# Patient Record
Sex: Female | Born: 1995
Health system: Southern US, Community
[De-identification: ages and names within clinical notes are randomized; demographics above are authoritative.]

## PROBLEM LIST (undated history)

## (undated) DIAGNOSIS — F329 Major depressive disorder, single episode, unspecified: Secondary | ICD-10-CM

## (undated) DIAGNOSIS — F32A Depression, unspecified: Secondary | ICD-10-CM

## (undated) DIAGNOSIS — F419 Anxiety disorder, unspecified: Secondary | ICD-10-CM

## (undated) HISTORY — DX: Depression, unspecified: F32.A

## (undated) HISTORY — PX: TONSILLECTOMY AND ADENOIDECTOMY: SUR1326

## (undated) HISTORY — PX: KNEE SURGERY: SHX244

## (undated) HISTORY — DX: Anxiety disorder, unspecified: F41.9

---
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
## 2018-10-22 DIAGNOSIS — F4323 Adjustment disorder with mixed anxiety and depressed mood: Secondary | ICD-10-CM | POA: Diagnosis not present

## 2018-12-21 DIAGNOSIS — E663 Overweight: Secondary | ICD-10-CM | POA: Diagnosis not present

## 2019-03-25 ENCOUNTER — Emergency Department: Payer: BC Managed Care – PPO

## 2019-03-25 ENCOUNTER — Encounter: Payer: Self-pay | Admitting: Emergency Medicine

## 2019-03-25 ENCOUNTER — Other Ambulatory Visit: Payer: Self-pay

## 2019-03-25 ENCOUNTER — Emergency Department
Admission: EM | Admit: 2019-03-25 | Discharge: 2019-03-25 | Disposition: A | Discharge status: Home / Self Care | Payer: BC Managed Care – PPO | Attending: Student in an Organized Health Care Education/Training Program | Admitting: Student in an Organized Health Care Education/Training Program

## 2019-03-25 DIAGNOSIS — S5012XA Contusion of left forearm, initial encounter: Secondary | ICD-10-CM | POA: Insufficient documentation

## 2019-03-25 DIAGNOSIS — Y999 Unspecified external cause status: Secondary | ICD-10-CM | POA: Diagnosis not present

## 2019-03-25 DIAGNOSIS — Y92410 Unspecified street and highway as the place of occurrence of the external cause: Secondary | ICD-10-CM | POA: Insufficient documentation

## 2019-03-25 DIAGNOSIS — Y9389 Activity, other specified: Secondary | ICD-10-CM | POA: Diagnosis not present

## 2019-03-25 DIAGNOSIS — S59912A Unspecified injury of left forearm, initial encounter: Secondary | ICD-10-CM | POA: Diagnosis present

## 2019-03-25 DIAGNOSIS — M542 Cervicalgia: Secondary | ICD-10-CM | POA: Diagnosis not present

## 2019-03-25 DIAGNOSIS — M79632 Pain in left forearm: Secondary | ICD-10-CM | POA: Diagnosis not present

## 2019-03-25 DIAGNOSIS — M7989 Other specified soft tissue disorders: Secondary | ICD-10-CM | POA: Diagnosis not present

## 2019-03-25 NOTE — ED Notes (Signed)
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
## 2019-03-25 NOTE — Discharge Instructions (Addendum)
Follow-up with your primary care provider or can no clinic acute care if any continued problems.  Apply ice to your forearm as needed for swelling and also to help with pain.  You may take Tylenol or ibuprofen as needed for pain.  You will be sore for approximately 4 to 5 days.  This is not unusual even with medication.  You may also use moist heat or ice to your muscles if needed for stiffness and soreness.
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
## 2019-05-19 ENCOUNTER — Other Ambulatory Visit (HOSPITAL_COMMUNITY)
Admission: RE | Admit: 2019-05-19 | Discharge: 2019-05-19 | Disposition: A | Discharge status: Home / Self Care | Payer: BC Managed Care – PPO | Source: Ambulatory Visit | Attending: Obstetrics and Gynecology | Admitting: Obstetrics and Gynecology

## 2019-05-19 ENCOUNTER — Other Ambulatory Visit: Payer: Self-pay

## 2019-05-19 ENCOUNTER — Encounter: Payer: Self-pay | Admitting: Obstetrics and Gynecology

## 2019-05-19 ENCOUNTER — Ambulatory Visit (INDEPENDENT_AMBULATORY_CARE_PROVIDER_SITE_OTHER): Payer: BC Managed Care – PPO | Admitting: Obstetrics and Gynecology

## 2019-05-19 ENCOUNTER — Telehealth: Payer: Self-pay | Admitting: Obstetrics and Gynecology

## 2019-05-19 VITALS — BP 120/70 | HR 66 | Ht 65.0 in | Wt 169.0 lb

## 2019-05-19 DIAGNOSIS — F329 Major depressive disorder, single episode, unspecified: Secondary | ICD-10-CM

## 2019-05-19 DIAGNOSIS — Z01419 Encounter for gynecological examination (general) (routine) without abnormal findings: Secondary | ICD-10-CM

## 2019-05-19 DIAGNOSIS — Z124 Encounter for screening for malignant neoplasm of cervix: Secondary | ICD-10-CM

## 2019-05-19 DIAGNOSIS — F419 Anxiety disorder, unspecified: Secondary | ICD-10-CM

## 2019-05-19 DIAGNOSIS — Z0000 Encounter for general adult medical examination without abnormal findings: Secondary | ICD-10-CM

## 2019-05-19 DIAGNOSIS — Z113 Encounter for screening for infections with a predominantly sexual mode of transmission: Secondary | ICD-10-CM

## 2019-05-19 DIAGNOSIS — Z3041 Encounter for surveillance of contraceptive pills: Secondary | ICD-10-CM

## 2019-05-19 MED ORDER — ORTHO-NOVUM 1/35 (28) 1-35 MG-MCG PO TABS: 1.0000 | ORAL_TABLET | Freq: Every day | ORAL | 11 refills | Status: DC

## 2019-05-19 MED ORDER — ALPRAZOLAM 0.5 MG PO TABS: 0.5000 mg | ORAL_TABLET | Freq: Two times a day (BID) | ORAL | 5 refills | Status: DC | PRN

## 2019-05-19 MED ORDER — ESCITALOPRAM OXALATE 10 MG PO TABS: 10.0000 mg | ORAL_TABLET | Freq: Every day | ORAL | 3 refills | Status: DC

## 2019-05-19 NOTE — Patient Instructions (Signed)
Therapists/Counselors/Psychologists   Karen [place], LPC  & Jacqlyn Krauss [place]    [phone]        [address]       [place], [place] [address]        Ival Bible, CSW [phone] [address] [place], [place] [address]  Harle Battiest, [place]        [phone]        [address]      [place], [place] [address]        Chyrel Masson, MS [phone] [address] [place], [place] [address]   Oscar La, LMFT       [phone]        [address]       [place], [place] [address]        Felecia Jan [phone] [address] [place], [place] [address]  Lester Posen        [phone]        [address]       [place], [place] [address]        Kerin Salen [phone] [address] [place], [place] [address]  Tyron Russell, PsyD       [phone]        [address]       [place], [place] [address]        Elita Quick, LPC [phone] [address]  [place], [place] [address]   Debarah Crape        [phone]        [address] [place], [place] [address]         Rosana Hoes [facility] [phone] [email or web address]   Sation Konchella       [phone]        [address]       [place], [place] [address]        Morton Stall [facility] [phone] [email or web address]   Coping With Anxiety  Anxiety is the feeling of nervousness or worry that you might experience when faced with a stressful event, like a test or a big sports game. Occasional stress and anxiety caused by work, school, relationships, or decision-making is a normal part of life, and it can be managed through certain lifestyle habits. However, some people may experience anxiety:  Without a specific trigger.  For long periods of time.  That causes physical problems over time.  That is far more intense than typical stress. When these feelings become  overwhelming and interfere with daily activities and relationships, it may indicate an anxiety disorder. If you receive a diagnosis of an anxiety disorder, your health care provider will tell you which type of anxiety you have and the possible treatments to help. How can anxiety affect me? Anxiety may make you feel uncomfortable. When you are faced with something exciting or potentially dangerous, your body responds in a way that prepares it to fight or run away. This response, called "fight or flight," is also a normal response to stress. When your brain initiates the fight and flight response, it tells your body to get the blood moving and prepare for the demands of the expected challenge. When this happens, you may experience:  A faster than usual heart rate.  Blood flowing to your big muscles  A feeling of tension and focus. In some situations, such as during  a big game or performance, this response a good thing and can help you perform better. However, in most situations, this response is not helpful. When the fight and flight response lasts for hours or days, it may cause:  Tiredness or exhaustion.  Sleep problems.  Upset stomach or nausea.  Headache.  Feelings of depression. Long-term anxiety may also cause you to:  Think negative thoughts about yourself.  Experience problems and conflicts in relationships.  Distance yourself from friends, family, and activities you enjoy.  Perform poorly in school, sports, work or extracurricular activities. What are things that I can do to deal with anxiety? When you experience anxiety, you can take steps to help manage it:  Talk with a trusted friend or family member about your thoughts and feelings. Identify two or three people who you think might help.  Find an activity that helps calm you down, such as: ? Deep breathing. ? Listening to music. ? Taking a walk. ? Exercising. ? Playing sports for fun. ? Playing an instrument. ?  Singing. ? Writing in a dairy. ? Drawing.  Watch a funny movie.  Read a good book.  Spend time with friends. What should I do if my anxiety gets worse? If these self-calming methods are not working or if your anxiety gets worse, you should get help from a health care provider. Talking with your health care provider or a mental health counselor is not a sign of weakness. Certain types of counseling can be very helpful in treating anxiety. A counseling professional can assess what other types of treatments could be most helpful for you. Other treatments include:  Talk therapy.  Medicines.  Biofeedback.  Meditation.  Yoga. Talk with your health care provider or counselor about what treatment options are right for you. Where can I get support? You may find that joining a support group helps you deal with your anxiety. Resources for locating counselors or support groups in your area are available from the following sources:  [place]: www.mentalhealthamerica.net  Anxiety and Depression Association of [place] (ADAA): https://www.clark.net/  National Alliance on Mental Illness (NAMI): www.nami.org This information is not intended to replace advice given to you by your health care provider. Make sure you discuss any questions you have with your health care provider. Document Released: 07/28/2016 Document Revised: 08/14/2017 Document Reviewed: 07/28/2016 Elsevier Patient Education  2020 [place] With Depression Depression is an experience of feeling down, blue, or sad. Depression can affect your thoughts and feelings, relationships, daily activities, and physical health. It is caused by changes in your brain that can be triggered by stress in your life or a serious loss. Everyone experiences occasional disappointment, sadness, and loss in their lives. When you are feeling down, blue, or sad for at least 2 weeks in a row, it may mean that you have depression. If you receive  a diagnosis of depression, your health care provider will tell you which type of depression you have and the possible treatments to help. How can depression affect me? Being depressed can make daily activities more difficult. It can negatively affect your daily life, from school and sports performance to work and relationships. When you are depressed, you may:  Want to be alone.  Avoid interacting with others.  Avoid doing the things you usually like to do.  Notice changes in your sleep habits.  Find it harder than usual to wake up and go to school or work.  Feel angry at everyone.  Feel like you do not have any patience.  Have trouble concentrating.  Feel tired all the time.  Notice changes in your appetite.  Lose or gain weight without trying.  Have constant headaches or stomachaches.  Think about death or attempting suicide often. What are things I can do to deal with depression? If you have had symptoms of depression for more than 2 weeks, talk with your parents or an adult you trust, such as a Veterinary surgeoncounselor at school or church or a Psychologist, occupationalcoach. You might be tempted to only tell friends, but you should tell an adult too. The hardest step in dealing with depression is admitting that you are feeling it to someone. The more people who know, the more likely you will be to get some help. Certain types of counseling can be very helpful in treating depression. A counseling professional can assess what treatments are going to be most helpful for you. These may include:  Talk therapy.  Medicines.  Brain stimulation therapy. There are a number of other things you can do that can help you cope with depression on a daily basis, including:  Spending time in nature.  Spending time with trusted friends who help you feel better.  Taking time to think about the positive things in your life and to feel grateful for them.  Exercising, such as playing an active game with some friends or going for  a run.  Spending less time using electronics, especially at night before bed. The screens of TVs, computers, tablets, and phones make your brain think it is time to get up rather than go to bed.  Avoiding spending too much time spacing out on TV or video games. This might feel good for a while, but it ends up just being a way to avoid the feelings of depression. What should I do if my depression gets worse? If you are having trouble managing your depression or if your depression gets worse, talk to your health care provider about making adjustments to your treatment plan. You should get help immediately if:  You feel suicidal and are making a plan to commit suicide.  You are drinking or using drugs to stop the pain from your depression.  You are cutting yourself or thinking about cutting yourself.  You are thinking about hurting others and are making a plan to do so.  You believe the world would be better off without you in it.  You are isolating yourself completely and not talking with anyone. If you find yourself in any of these situations, you should do one of the following:  Immediately tell your parents or best friend.  Call and go see your health care provider or health professional.  Call the suicide prevention hotline (940-507-92471-661-346-7171 in the [place]  Text the crisis line 912-779-1023(741741 in the [place] Where can I get support? It is important to know that although depression is serious, you can find support from a variety of sources. Sources of help may include:  Suicide prevention, crisis prevention, and depression hotlines.  School teachers, counselors, Systems developercoaches, or clergy.  Parents or other family members.  Support groups. You can locate a counselor or support group in your area from one of the following sources:  Mental Health [place]: www.mentalhealthamerica.net  Anxiety and Depression Association of [place] (ADAA): ProgramCam.dewww.adaa.org  The First Americanational Alliance on Mental Illness  (NAMI): www.nami.org This information is not intended to replace advice given to you by your health care provider. Make sure you discuss any questions you  have with your health care provider. Document Released: 09/21/2015 Document Revised: 08/14/2017 Document Reviewed: 09/21/2015 Elsevier Patient Education  2020 [employer]

## 2019-05-19 NOTE — Progress Notes (Signed)
Gynecology Annual Exam  PCP: Patient, No Pcp Per  Chief Complaint:  Chief Complaint  Patient presents with  . Gynecologic Exam    History of Present Illness: Patient is a 23 y.o. G0P0000 presents for annual exam. The patient has no complaints today.   LMP: Patient's last menstrual period was 05/10/2019 (exact date). Average Interval: regular, 28 days Duration of flow: 4 days Heavy Menses: no Clots: no Intermenstrual Bleeding: no Postcoital Bleeding: no Dysmenorrhea: no  The patient is not currently sexually active. She currently uses OCP (estrogen/progesterone) for contraception. She denies dyspareunia.  The patient does perform self breast exams.  There is no notable family history of breast or ovarian cancer in her family.  The patient wears seatbelts: yes.  The patient has regular exercise: yes.    The patient repots current symptoms of depression.    Review of Systems: ROS  Past Medical History:  Past Medical History:  Diagnosis Date  . Anxiety   . Depression     Past Surgical History:  Past Surgical History:  Procedure Laterality Date  . KNEE SURGERY    . TONSILLECTOMY AND ADENOIDECTOMY      Gynecologic History:  Patient's last menstrual period was 05/10/2019 (exact date). Contraception: OCP (estrogen/progesterone) Last Pap: Results were: Never    Obstetric History: G0P0000  Family History:  History reviewed. No pertinent family history.  Social History:  Social History   Socioeconomic History  . Marital status: Single    Spouse name: Not on file  . Number of children: Not on file  . Years of education: Not on file  . Highest education level: Not on file  Occupational History  . Not on file  Social Needs  . Financial resource strain: Not on file  . Food insecurity    Worry: Not on file    Inability: Not on file  . Transportation needs    Medical: Not on file    Non-medical: Not on file  Tobacco Use  . Smoking status: Never Smoker   . Smokeless tobacco: Never Used  Substance and Sexual Activity  . Alcohol use: Yes    Frequency: Never  . Drug use: Never  . Sexual activity: Not Currently    Birth control/protection: Pill  Lifestyle  . Physical activity    Days per week: Not on file    Minutes per session: Not on file  . Stress: Not on file  Relationships  . Social Herbalist on phone: Not on file    Gets together: Not on file    Attends religious service: Not on file    Active member of club or organization: Not on file    Attends meetings of clubs or organizations: Not on file    Relationship status: Not on file  . Intimate partner violence    Fear of current or ex partner: Not on file    Emotionally abused: Not on file    Physically abused: Not on file    Forced sexual activity: Not on file  Other Topics Concern  . Not on file  Social History Narrative  . Not on file    Allergies:  No Known Allergies  Medications: Prior to Admission medications   Medication Sig Start Date End Date Taking? Authorizing Provider  [facility] 7/7/7 0.5/0.75/1-35 MG-MCG tablet TK 1 T PO QD 05/10/19  Yes [provider]  norethindrone-ethinyl estradiol 1/35 ([place] 1/35, 28,) tablet Take 1 tablet by mouth daily. 05/19/19  Natale MilchSchuman, Corinthia Helmers R, MD    Physical Exam Vitals: Blood pressure 120/70, pulse 66, height 5\' 5"  (1.651 m), weight 169 lb (76.7 kg), last menstrual period 05/10/2019.  General: NAD HEENT: normocephalic, anicteric Thyroid: no enlargement, no palpable nodules Pulmonary: No increased work of breathing, CTAB Cardiovascular: RRR, distal pulses 2+ Breast: Breast symmetrical, no tenderness, no palpable nodules or masses, no skin or nipple retraction present, no nipple discharge.  No axillary or supraclavicular lymphadenopathy. Abdomen: NABS, soft, non-tender, non-distended.  Umbilicus without lesions.  No hepatomegaly, splenomegaly or masses palpable. No evidence of hernia   Genitourinary:  External: Normal external female genitalia.  Normal urethral meatus, normal Bartholin's and Skene's glands.    Vagina: Normal vaginal mucosa, no evidence of prolapse.    Cervix: Grossly normal in appearance, no bleeding  Uterus: Non-enlarged, mobile, normal contour.  No CMT  Adnexa: ovaries non-enlarged, no adnexal masses  Rectal: deferred  Lymphatic: no evidence of inguinal lymphadenopathy Extremities: no edema, erythema, or tenderness Neurologic: Grossly intact Psychiatric: mood appropriate, affect full  Female chaperone present for pelvic and breast  portions of the physical exam    Assessment: 23 y.o. G0P0000 routine annual exam  Plan: Problem List Items Addressed This Visit    None    Visit Diagnoses    Healthcare maintenance    -  Primary   Cervical cancer screening       Relevant Orders   Cytology - PAP   Screening for STD (sexually transmitted disease)       Encounter for birth control pills maintenance       Relevant Medications   norethindrone-ethinyl estradiol 1/35 (ORTHO-NOVUM 1/35, 28,) tablet   Anxiety and depression       Relevant Medications   ALPRAZolam (XANAX) 0.5 MG tablet   escitalopram (LEXAPRO) 10 MG tablet      1)  Gardasil Series discussed and if applicable offered to patient - Patient has previously completed 3 shot series   2) STI screening  was offered and accepted  3)  ASCCP guidelines and rational discussed.  Patient opts for every 3 years screening interval  4) Contraception - the patient is currently using  OCP (estrogen/progesterone).  She is happy with her current form of contraception and plans to continue We discussed safe sex practices to reduce her furture risk of STI's.    5) Depression and anxiety- patient will start medication with SSRI and anxiolytic. Discussed coping techniques as well.  Given list of local therapists. Will follow up for response to medication.   6) Return in about 4 weeks (around 06/16/2019)  for telephone or in person GYN follow up.  Adelene Idlerhristanna Johnothan Bascomb MD [facility], [facility] 05/19/2019 8:35 PM

## 2019-05-25 LAB — CYTOLOGY - PAP
Chlamydia: NEGATIVE
Diagnosis: NEGATIVE
Neisseria Gonorrhea: NEGATIVE

## 2019-05-25 NOTE — Progress Notes (Signed)
Please call patient with normal result. Encourage her to set up mychart Thank you Dr. Gilman Schmidt

## 2019-05-27 DIAGNOSIS — F4323 Adjustment disorder with mixed anxiety and depressed mood: Secondary | ICD-10-CM | POA: Diagnosis not present

## 2019-06-10 DIAGNOSIS — F4323 Adjustment disorder with mixed anxiety and depressed mood: Secondary | ICD-10-CM | POA: Diagnosis not present

## 2019-06-16 ENCOUNTER — Ambulatory Visit (INDEPENDENT_AMBULATORY_CARE_PROVIDER_SITE_OTHER): Payer: Self-pay | Admitting: Obstetrics and Gynecology

## 2019-06-16 ENCOUNTER — Other Ambulatory Visit: Payer: Self-pay

## 2019-06-16 ENCOUNTER — Encounter: Payer: Self-pay | Admitting: Obstetrics and Gynecology

## 2019-06-16 DIAGNOSIS — F329 Major depressive disorder, single episode, unspecified: Secondary | ICD-10-CM

## 2019-06-16 DIAGNOSIS — F419 Anxiety disorder, unspecified: Secondary | ICD-10-CM

## 2019-06-16 NOTE — Progress Notes (Signed)
Virtual Visit via Telephone Note  I connected with Maria Andersen on 06/16/19 at  9:10 AM EDT by telephone and verified that I am speaking with the correct person using two identifiers.   I discussed the limitations, risks, security and privacy concerns of performing an evaluation and management service by telephone and the availability of in person appointments. I also discussed with the patient that there may be a patient responsible charge related to this service. The patient expressed understanding and agreed to proceed.  The patient was at home I spoke with the patient from my  office The names of people involved in this encounter were: Maria Andersen and Dr. Gilman Schmidt.   History of Present Illness: She reports that she has been doing significantly better since her most recent visit. She is happy with her medication. She feels less depressed and anxious. She feels less tired as well.  She has started seeing a therapist every two weeks and reports that this is helping her a lot.    Observations/Objective:   Physical Exam could not be performed. Because of the COVID-19 outbreak this visit was performed over the phone and not in person.   Assessment and Plan: 23 yo following up for depression and anxiety.  Continue current medications. Continue with therapy   Follow Up Instructions: Return in 1 year for annual exam   I discussed the assessment and treatment plan with the patient. The patient was provided an opportunity to ask questions and all were answered. The patient agreed with the plan and demonstrated an understanding of the instructions.   The patient was advised to call back or seek an in-person evaluation if the symptoms worsen or if the condition fails to improve as anticipated.  I provided 5 minutes of non-face-to-face time during this encounter.  Adrian Prows MD [facility], [facility] 06/16/2019 9:13 AM

## 2019-07-12 DIAGNOSIS — F4323 Adjustment disorder with mixed anxiety and depressed mood: Secondary | ICD-10-CM | POA: Diagnosis not present

## 2019-07-26 DIAGNOSIS — F4323 Adjustment disorder with mixed anxiety and depressed mood: Secondary | ICD-10-CM | POA: Diagnosis not present

## 2019-08-09 DIAGNOSIS — F4323 Adjustment disorder with mixed anxiety and depressed mood: Secondary | ICD-10-CM | POA: Diagnosis not present

## 2019-08-25 DIAGNOSIS — F4323 Adjustment disorder with mixed anxiety and depressed mood: Secondary | ICD-10-CM | POA: Diagnosis not present

## 2019-09-13 IMAGING — CR CERVICAL SPINE - 2-3 VIEW
4 series · 4 of 4 positions shown · non-contrast
Comparison: None.

CLINICAL DATA: Pain

EXAM:
CERVICAL SPINE - 2-3 VIEW

[c-spine lat]
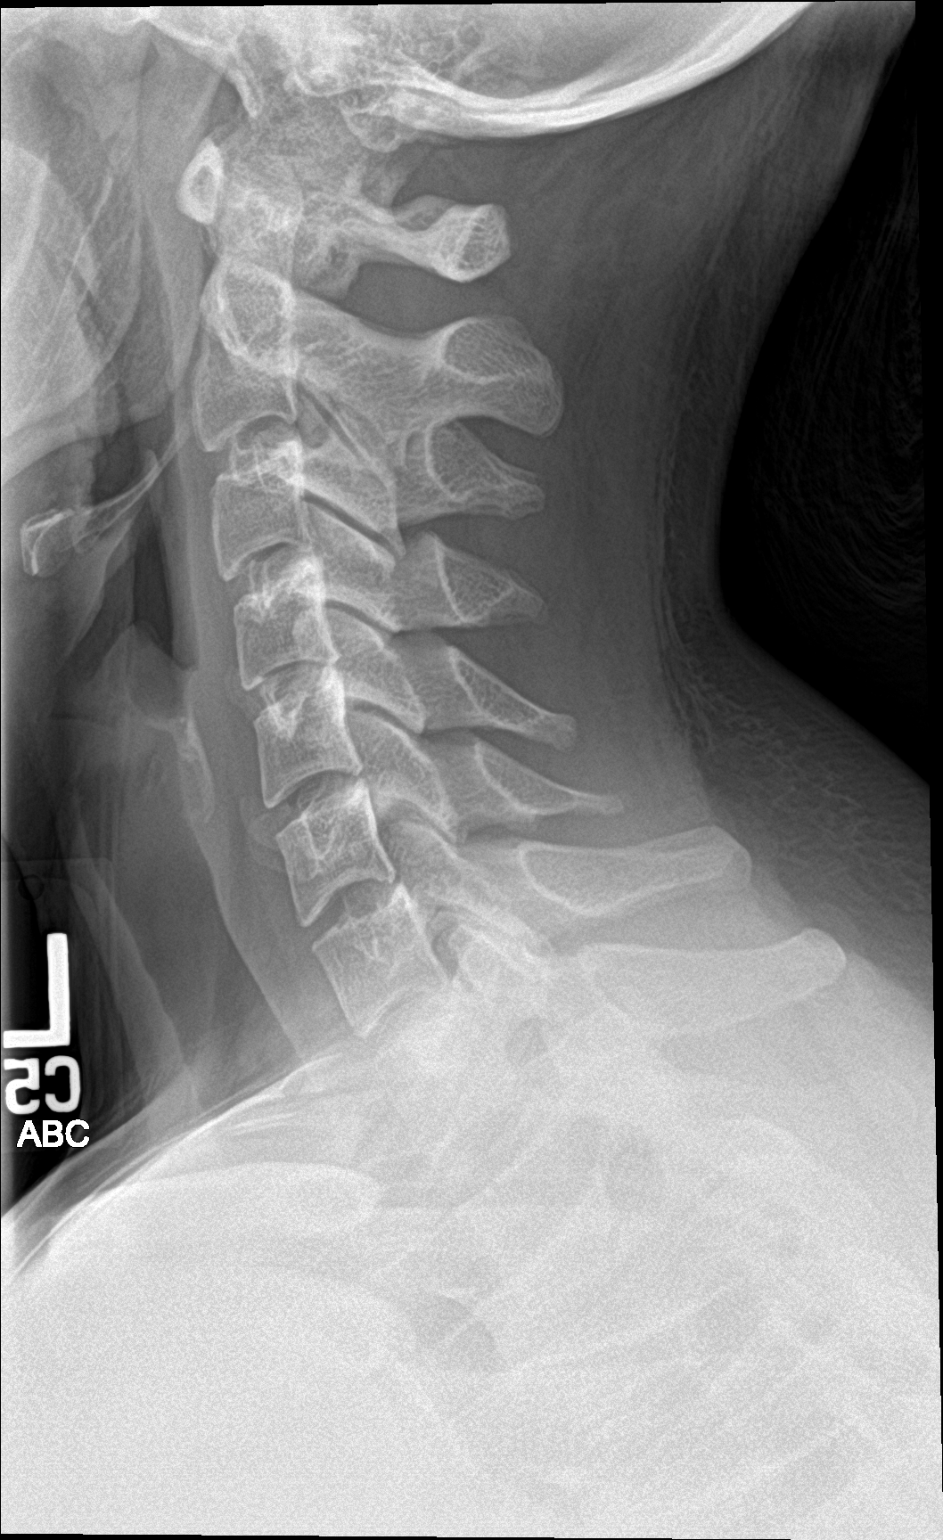

[c-spine ap]
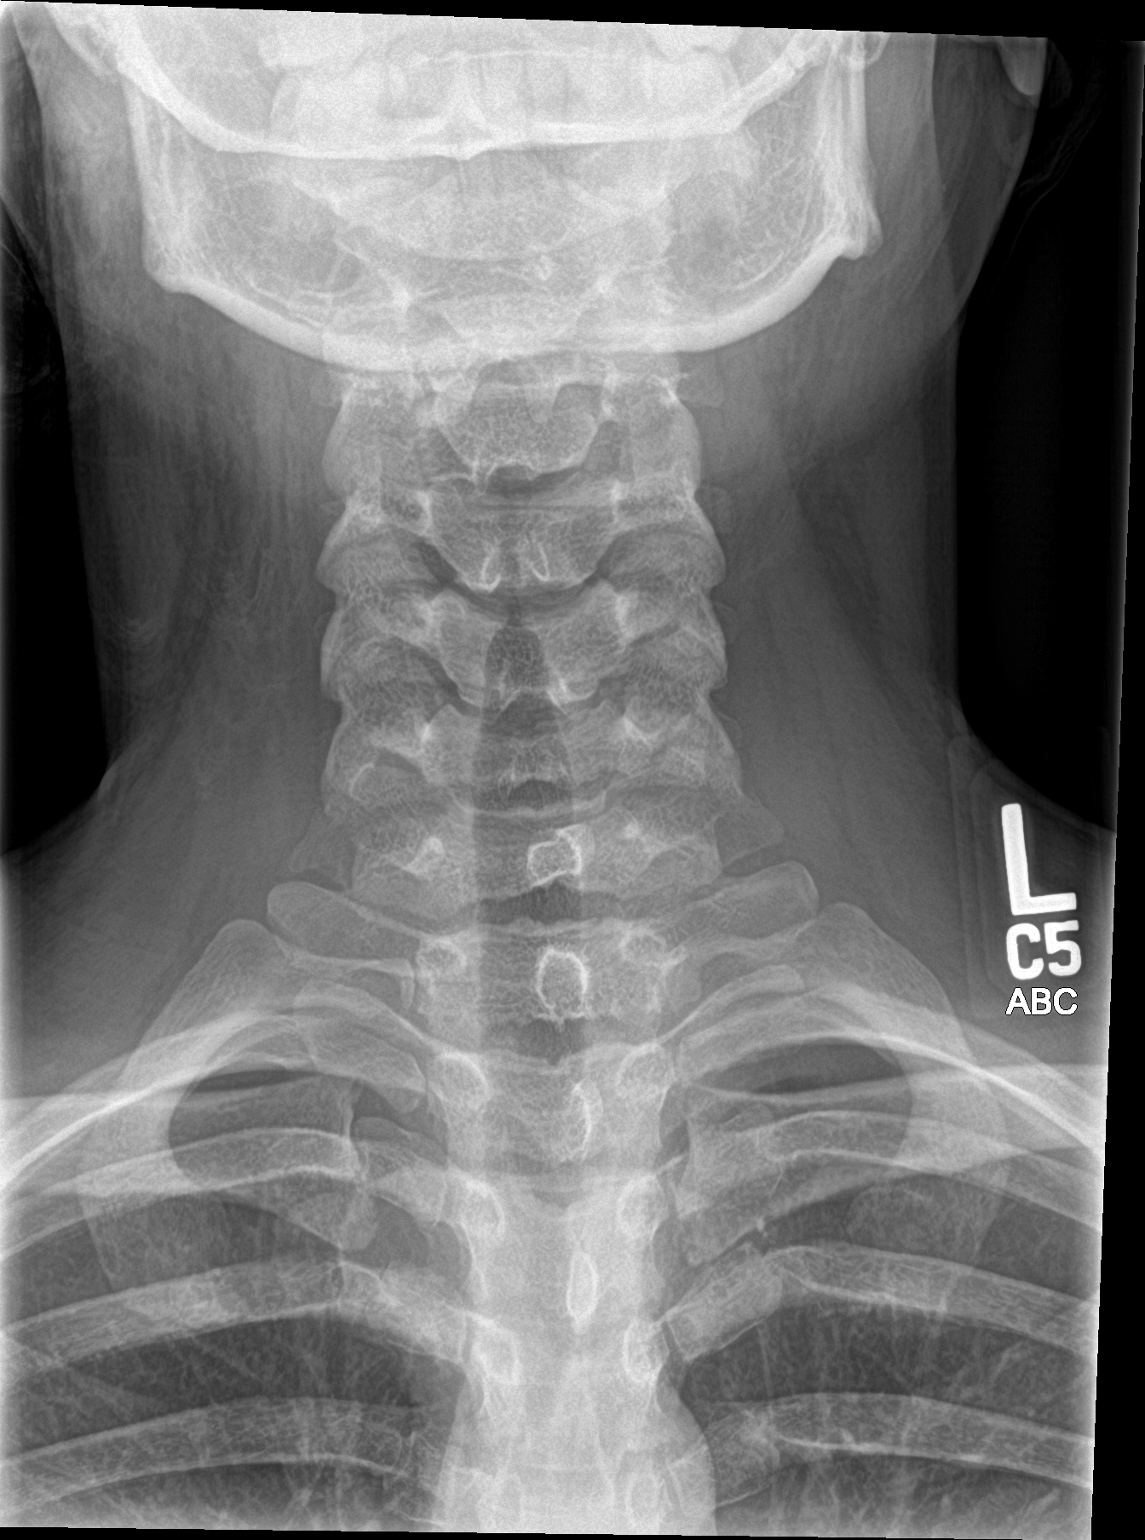

[c-spine open mouth (1 of 2)]
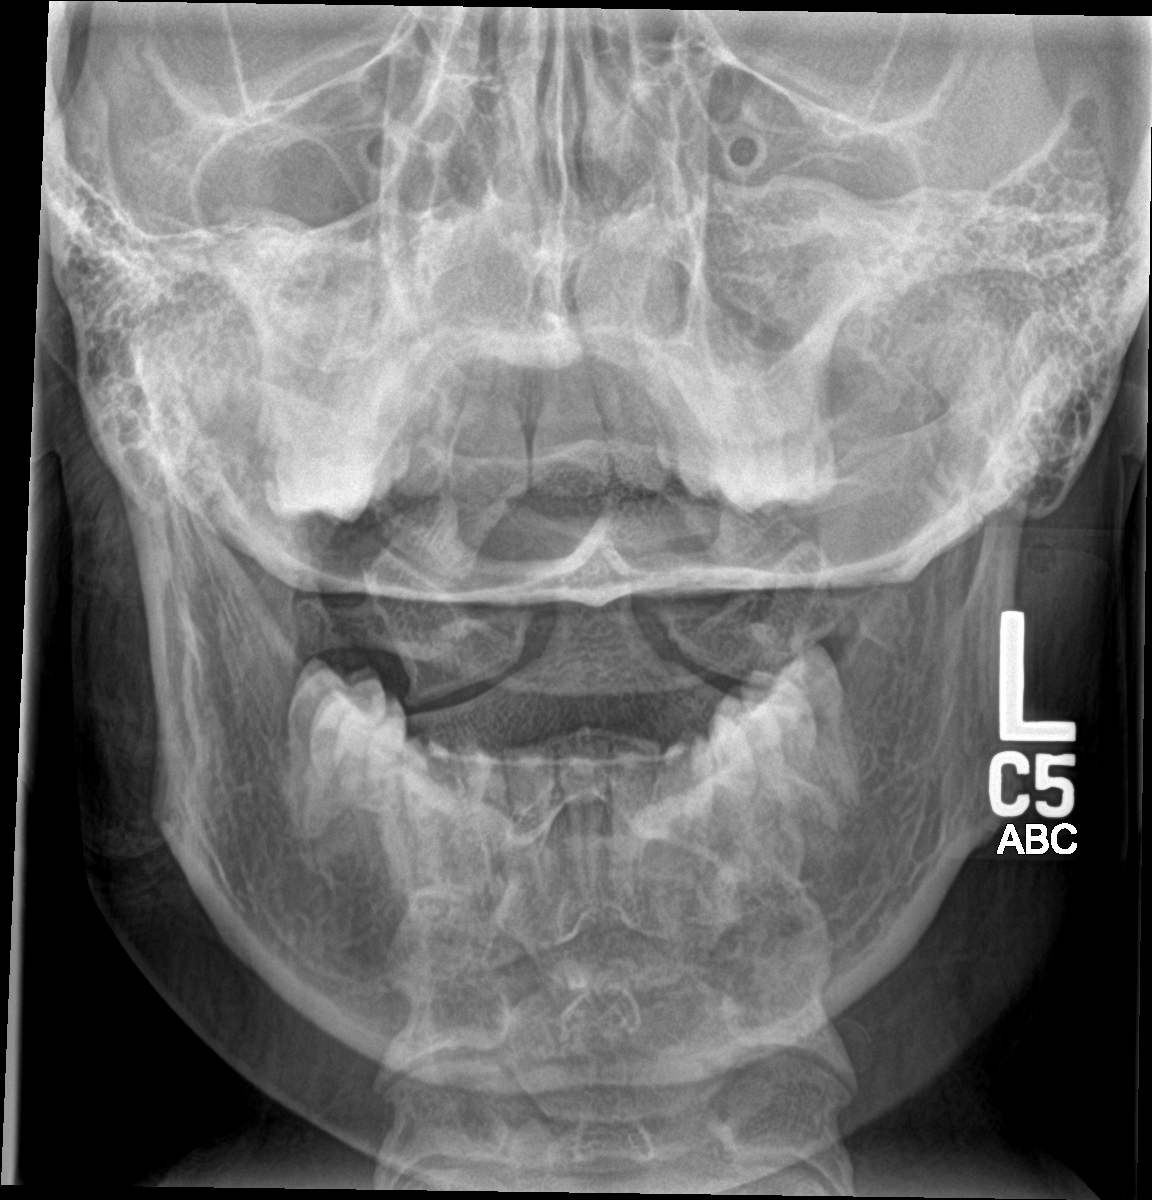

[c-spine open mouth (2 of 2)]
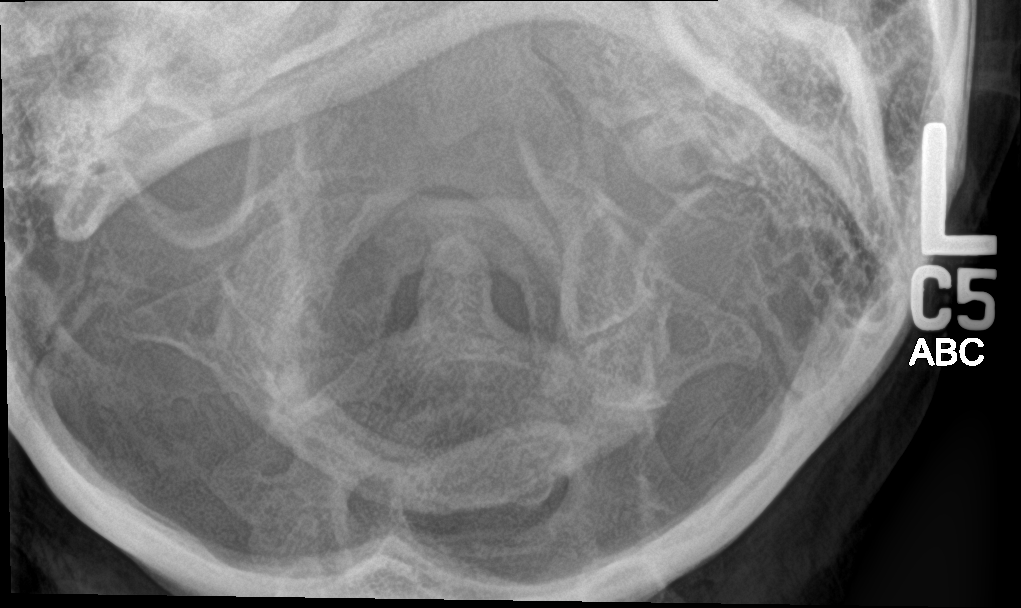

[4 of 4 positions shown; findings below may reference images not displayed]

FINDINGS: There is no evidence of cervical spine fracture or prevertebral soft
tissue swelling. Alignment is normal. No other significant bone
abnormalities are identified.
IMPRESSION: Negative cervical spine radiographs.

## 2019-09-27 DIAGNOSIS — F4323 Adjustment disorder with mixed anxiety and depressed mood: Secondary | ICD-10-CM | POA: Diagnosis not present

## 2019-10-18 DIAGNOSIS — F4323 Adjustment disorder with mixed anxiety and depressed mood: Secondary | ICD-10-CM | POA: Diagnosis not present

## 2019-11-04 DIAGNOSIS — F4323 Adjustment disorder with mixed anxiety and depressed mood: Secondary | ICD-10-CM | POA: Diagnosis not present

## 2019-11-19 ENCOUNTER — Other Ambulatory Visit: Payer: Self-pay | Admitting: Obstetrics and Gynecology

## 2019-11-19 DIAGNOSIS — F329 Major depressive disorder, single episode, unspecified: Secondary | ICD-10-CM

## 2019-11-19 DIAGNOSIS — F419 Anxiety disorder, unspecified: Secondary | ICD-10-CM

## 2019-12-06 DIAGNOSIS — F4323 Adjustment disorder with mixed anxiety and depressed mood: Secondary | ICD-10-CM | POA: Diagnosis not present

## 2020-01-03 DIAGNOSIS — F4323 Adjustment disorder with mixed anxiety and depressed mood: Secondary | ICD-10-CM | POA: Diagnosis not present

## 2020-01-17 DIAGNOSIS — F4323 Adjustment disorder with mixed anxiety and depressed mood: Secondary | ICD-10-CM | POA: Diagnosis not present

## 2020-01-31 DIAGNOSIS — F4323 Adjustment disorder with mixed anxiety and depressed mood: Secondary | ICD-10-CM | POA: Diagnosis not present

## 2020-05-10 ENCOUNTER — Other Ambulatory Visit: Payer: Self-pay | Admitting: Obstetrics and Gynecology

## 2020-05-10 DIAGNOSIS — Z3041 Encounter for surveillance of contraceptive pills: Secondary | ICD-10-CM

## 2020-05-15 ENCOUNTER — Telehealth: Payer: Self-pay | Admitting: Obstetrics and Gynecology

## 2020-05-15 MED ORDER — ORTHO-NOVUM 1/35 (28) 1-35 MG-MCG PO TABS: 1.0000 | ORAL_TABLET | Freq: Every day | ORAL | 0 refills | Status: DC

## 2020-05-15 NOTE — Telephone Encounter (Signed)
Patient is scheduled for 06/12/20 for annual with CRS. Patient is requesting birth control refill to [facility] on [address] [place]. Please advise

## 2020-05-15 NOTE — Telephone Encounter (Signed)
Meds has been refilled.

## 2020-05-15 NOTE — Addendum Note (Signed)
Addended by: Clement Husbands A on: 05/15/2020 03:47 PM   Modules accepted: Orders

## 2020-06-09 ENCOUNTER — Other Ambulatory Visit: Payer: Self-pay | Admitting: Obstetrics and Gynecology

## 2020-06-09 DIAGNOSIS — Z3041 Encounter for surveillance of contraceptive pills: Secondary | ICD-10-CM

## 2020-06-12 ENCOUNTER — Ambulatory Visit (INDEPENDENT_AMBULATORY_CARE_PROVIDER_SITE_OTHER): Payer: BC Managed Care – PPO | Admitting: Obstetrics and Gynecology

## 2020-06-12 ENCOUNTER — Encounter: Payer: Self-pay | Admitting: Obstetrics and Gynecology

## 2020-06-12 ENCOUNTER — Other Ambulatory Visit: Payer: Self-pay

## 2020-06-12 VITALS — BP 114/70 | HR 64 | Resp 18 | Ht 65.0 in | Wt 187.0 lb

## 2020-06-12 DIAGNOSIS — Z3041 Encounter for surveillance of contraceptive pills: Secondary | ICD-10-CM

## 2020-06-12 DIAGNOSIS — Z0000 Encounter for general adult medical examination without abnormal findings: Secondary | ICD-10-CM

## 2020-06-12 DIAGNOSIS — Z01419 Encounter for gynecological examination (general) (routine) without abnormal findings: Secondary | ICD-10-CM | POA: Diagnosis not present

## 2020-06-12 MED ORDER — NORTREL 1/35 (28) 1-35 MG-MCG PO TABS: 1.0000 | ORAL_TABLET | Freq: Every day | ORAL | 11 refills | Status: DC

## 2020-06-12 NOTE — Patient Instructions (Signed)
Institute of Medicine Recommended Dietary Allowances for Calcium and Vitamin D  Age (yr) Calcium Recommended Dietary Allowance (mg/day) Vitamin D Recommended Dietary Allowance (international units/day)  9-18 1,300 600  19-50 1,000 600  51-70 1,200 600  71 and older 1,200 800  Data from Institute of Medicine. Dietary reference intakes: calcium, vitamin D. [place], [place]: National Academies Press; 2011.     Exercising to Stay Healthy To become healthy and stay healthy, it is recommended that you do moderate-intensity and vigorous-intensity exercise. You can tell that you are exercising at a moderate intensity if your heart starts beating faster and you start breathing faster but can still hold a conversation. You can tell that you are exercising at a vigorous intensity if you are breathing much harder and faster and cannot hold a conversation while exercising. Exercising regularly is important. It has many health benefits, such as:  Improving overall fitness, flexibility, and endurance.  Increasing bone density.  Helping with weight control.  Decreasing body fat.  Increasing muscle strength.  Reducing stress and tension.  Improving overall health. How often should I exercise? Choose an activity that you enjoy, and set realistic goals. Your health care provider can help you make an activity plan that works for you. Exercise regularly as told by your health care provider. This may include:  Doing strength training two times a week, such as: ? Lifting weights. ? Using resistance bands. ? Push-ups. ? Sit-ups. ? Yoga.  Doing a certain intensity of exercise for a given amount of time. Choose from these options: ? A total of 150 minutes of moderate-intensity exercise every week. ? A total of 75 minutes of vigorous-intensity exercise every week. ? A mix of moderate-intensity and vigorous-intensity exercise every week. Children, pregnant women, people who have not exercised  regularly, people who are overweight, and older adults may need to talk with a health care provider about what activities are safe to do. If you have a medical condition, be sure to talk with your health care provider before you start a new exercise program. What are some exercise ideas? Moderate-intensity exercise ideas include:  Walking 1 mile (1.6 km) in about 15 minutes.  Biking.  Hiking.  Golfing.  Dancing.  Water aerobics. Vigorous-intensity exercise ideas include:  Walking 4.5 miles (7.2 km) or more in about 1 hour.  Jogging or running 5 miles (8 km) in about 1 hour.  Biking 10 miles (16.1 km) or more in about 1 hour.  Lap swimming.  Roller-skating or in-line skating.  Cross-country skiing.  Vigorous competitive sports, such as football, basketball, and soccer.  Jumping rope.  Aerobic dancing. What are some everyday activities that can help me to get exercise?  Yard work, such as: ? Pushing a lawn mower. ? Raking and bagging leaves.  Washing your car.  Pushing a stroller.  Shoveling snow.  Gardening.  Washing windows or floors. How can I be more active in my day-to-day activities?  Use stairs instead of an elevator.  Take a walk during your lunch break.  If you drive, park your car farther away from your work or school.  If you take public transportation, get off one stop early and walk the rest of the way.  Stand up or walk around during all of your indoor phone calls.  Get up, stretch, and walk around every 30 minutes throughout the day.  Enjoy exercise with a friend. Support to continue exercising will help you keep a regular routine of activity. What guidelines can   I follow while exercising?  Before you start a new exercise program, talk with your health care provider.  Do not exercise so much that you hurt yourself, feel dizzy, or get very short of breath.  Wear comfortable clothes and wear shoes with good support.  Drink plenty of  water while you exercise to prevent dehydration or heat stroke.  Work out until your breathing and your heartbeat get faster. Where to find more information  U.S. Department of Health and Human Services: www.hhs.gov  Centers for Disease Control and Prevention (CDC): www.cdc.gov Summary  Exercising regularly is important. It will improve your overall fitness, flexibility, and endurance.  Regular exercise also will improve your overall health. It can help you control your weight, reduce stress, and improve your bone density.  Do not exercise so much that you hurt yourself, feel dizzy, or get very short of breath.  Before you start a new exercise program, talk with your health care provider. This information is not intended to replace advice given to you by your health care provider. Make sure you discuss any questions you have with your health care provider. Document Revised: 08/14/2017 Document Reviewed: 07/23/2017 Elsevier Patient Education  2020 Elsevier Inc.   Budget-Friendly Healthy Eating There are many ways to save money at the grocery store and continue to eat healthy. You can be successful if you:  Plan meals according to your budget.  Make a grocery list and only purchase food according to your grocery list.  Prepare food yourself. What are tips for following this plan?  Reading food labels  Compare food labels between brand name foods and the store brand. Often the nutritional value is the same, but the store brand is lower cost.  Look for products that do not have added sugar, fat, or salt (sodium). These often cost the same but are healthier for you. Products may be labeled as: ? Sugar-free. ? Nonfat. ? Low-fat. ? Sodium-free. ? Low-sodium.  Look for lean ground beef labeled as at least 92% lean and 8% fat. Shopping  Buy only the items on your grocery list and go only to the areas of the store that have the items on your list.  Use coupons only for foods  and brands you normally buy. Avoid buying items you wouldn't normally buy simply because they are on sale.  Check online and in newspapers for weekly deals.  Buy healthy items from the bulk bins when available, such as herbs, spices, flour, pasta, nuts, and dried fruit.  Buy fruits and vegetables that are in season. Prices are usually lower on in-season produce.  Look at the unit price on the price tag. Use it to compare different brands and sizes to find out which item is the best deal.  Choose healthy items that are often low-cost, such as carrots, potatoes, apples, bananas, and oranges. Dried or canned beans are a low-cost protein source.  Buy in bulk and freeze extra food. Items you can buy in bulk include meats, fish, poultry, frozen fruits, and frozen vegetables.  Avoid buying "ready-to-eat" foods, such as pre-cut fruits and vegetables and pre-made salads.  If possible, shop around to discover where you can find the best prices. Consider other retailers such as dollar stores, larger wholesale stores, local fruit and vegetable stands, and farmers markets.  Do not shop when you are hungry. If you shop while hungry, it may be hard to stick to your list and budget.  Resist impulse buying. Use your grocery list as   your official plan for the week.  Buy a variety of vegetables and fruits by purchasing fresh, frozen, and canned items.  Look at the top and bottom shelves for deals. Foods at eye level (eye level of an adult or child) are usually more expensive.  Be efficient with your time when shopping. The more time you spend at the store, the more money you are likely to spend.  To save money when choosing more expensive foods like meats and dairy: ? Choose cheaper cuts of meat, such as bone-in chicken thighs and drumsticks instead of skinless and boneless chicken. When you are ready to prepare the chicken, you can remove the skin yourself to make it healthier. ? Choose lean meats like  chicken or turkey instead of beef. ? Choose canned seafood, such as tuna, salmon, or sardines. ? Buy eggs as a low-cost source of protein. ? Buy dried beans and peas, such as lentils, split peas, or kidney beans instead of meats. Dried beans and peas are a good alternative source of protein. ? Buy the larger tubs of yogurt instead of individual-sized containers.  Choose water instead of sodas and other sweetened beverages.  Avoid buying chips, cookies, and other "junk food." These items are usually expensive and not healthy. Cooking  Make extra food and freeze the extras in meal-sized containers or in individual portions for fast meals and snacks.  Pre-cook on days when you have extra time to prepare meals in advance. You can keep these meals in the fridge or freezer and reheat for a quick meal.  When you come home from the grocery store, wash, peel, and cut fruits and vegetables so they are ready to use and eat. This will help reduce food waste. Meal planning  Do not eat out or get fast food. Prepare food at home.  Make a grocery list and make sure to bring it with you to the store. If you have a smart phone, you could use your phone to create your shopping list.  Plan meals and snacks according to a grocery list and budget you create.  Use leftovers in your meal plan for the week.  Look for recipes where you can cook once and make enough food for two meals.  Include budget-friendly meals like stews, casseroles, and stir-fry dishes.  Try some meatless meals or try "no cook" meals like salads.  Make sure that half your plate is filled with fruits or vegetables. Choose from fresh, frozen, or canned fruits and vegetables. If eating canned, remember to rinse them before eating. This will remove any excess salt added for packaging. Summary  Eating healthy on a budget is possible if you plan your meals according to your budget, purchase according to your budget and grocery list, and  prepare food yourself.  Tips for buying more food on a limited budget include buying generic brands, using coupons only for foods you normally buy, and buying healthy items from the bulk bins when available.  Tips for buying cheaper food to replace expensive food include choosing cheaper, lean cuts of meat, and buying dried beans and peas. This information is not intended to replace advice given to you by your health care provider. Make sure you discuss any questions you have with your health care provider. Document Revised: 09/02/2017 Document Reviewed: 09/02/2017 Elsevier Patient Education  2020 Elsevier Inc.   Bone Health Bones protect organs, store calcium, anchor muscles, and support the whole body. Keeping your bones strong is important, especially as you   get older. You can take actions to help keep your bones strong and healthy. Why is keeping my bones healthy important?  Keeping your bones healthy is important because your body constantly replaces bone cells. Cells get old, and new cells take their place. As we age, we lose bone cells because the body may not be able to make enough new cells to replace the old cells. The amount of bone cells and bone tissue you have is referred to as bone mass. The higher your bone mass, the stronger your bones. The aging process leads to an overall loss of bone mass in the body, which can increase the likelihood of:  Joint pain and stiffness.  Broken bones.  A condition in which the bones become weak and brittle (osteoporosis). A large decline in bone mass occurs in older adults. In women, it occurs about the time of menopause. What actions can I take to keep my bones healthy? Good health habits are important for maintaining healthy bones. This includes eating nutritious foods and exercising regularly. To have healthy bones, you need to get enough of the right minerals and vitamins. Most nutrition experts recommend getting these nutrients from the  foods that you eat. In some cases, taking supplements may also be recommended. Doing certain types of exercise is also important for bone health. What are the nutritional recommendations for healthy bones?  Eating a well-balanced diet with plenty of calcium and vitamin D will help to protect your bones. Nutritional recommendations vary from person to person. Ask your health care provider what is healthy for you. Here are some general guidelines. Get enough calcium Calcium is the most important (essential) mineral for bone health. Most people can get enough calcium from their diet, but supplements may be recommended for people who are at risk for osteoporosis. Good sources of calcium include:  Dairy products, such as low-fat or nonfat milk, cheese, and yogurt.  Dark green leafy vegetables, such as bok choy and broccoli.  Calcium-fortified foods, such as orange juice, cereal, bread, soy beverages, and tofu products.  Nuts, such as almonds. Follow these recommended amounts for daily calcium intake:  Children, age 1-3: 700 mg.  Children, age 4-8: 1,000 mg.  Children, age 9-13: 1,300 mg.  Teens, age 14-18: 1,300 mg.  Adults, age 19-50: 1,000 mg.  Adults, age 51-70: ? Men: 1,000 mg. ? Women: 1,200 mg.  Adults, age 71 or older: 1,200 mg.  Pregnant and breastfeeding females: ? Teens: 1,300 mg. ? Adults: 1,000 mg. Get enough vitamin D Vitamin D is the most essential vitamin for bone health. It helps the body absorb calcium. Sunlight stimulates the skin to make vitamin D, so be sure to get enough sunlight. If you live in a cold climate or you do not get outside often, your health care provider may recommend that you take vitamin D supplements. Good sources of vitamin D in your diet include:  Egg yolks.  Saltwater fish.  Milk and cereal fortified with vitamin D. Follow these recommended amounts for daily vitamin D intake:  Children and teens, age 1-18: 600 international  units.  Adults, age 50 or younger: 400-800 international units.  Adults, age 51 or older: 800-1,000 international units. Get other important nutrients Other nutrients that are important for bone health include:  Phosphorus. This mineral is found in meat, poultry, dairy foods, nuts, and legumes. The recommended daily intake for adult men and adult women is 700 mg.  Magnesium. This mineral is found in seeds, nuts, dark   green vegetables, and legumes. The recommended daily intake for adult men is 400-420 mg. For adult women, it is 310-320 mg.  Vitamin K. This vitamin is found in green leafy vegetables. The recommended daily intake is 120 mg for adult men and 90 mg for adult women. What type of physical activity is best for building and maintaining healthy bones? Weight-bearing and strength-building activities are important for building and maintaining healthy bones. Weight-bearing activities cause muscles and bones to work against gravity. Strength-building activities increase the strength of the muscles that support bones. Weight-bearing and muscle-building activities include:  Walking and hiking.  Jogging and running.  Dancing.  Gym exercises.  Lifting weights.  Tennis and racquetball.  Climbing stairs.  Aerobics. Adults should get at least 30 minutes of moderate physical activity on most days. Children should get at least 60 minutes of moderate physical activity on most days. Ask your health care provider what type of exercise is best for you. How can I find out if my bone mass is low? Bone mass can be measured with an X-ray test called a bone mineral density (BMD) test. This test is recommended for all women who are age 65 or older. It may also be recommended for:  Men who are age 70 or older.  People who are at risk for osteoporosis because of: ? Having bones that break easily. ? Having a long-term disease that weakens bones, such as kidney disease or rheumatoid  arthritis. ? Having menopause earlier than normal. ? Taking medicine that weakens bones, such as steroids, thyroid hormones, or hormone treatment for breast cancer or prostate cancer. ? Smoking. ? Drinking three or more alcoholic drinks a day. If you find that you have a low bone mass, you may be able to prevent osteoporosis or further bone loss by changing your diet and lifestyle. Where can I find more information? For more information, check out the following websites:  National Osteoporosis Foundation: www.nof.org/patients  National Institutes of Health: www.bones.nih.gov  International Osteoporosis Foundation: www.iofbonehealth.org Summary  The aging process leads to an overall loss of bone mass in the body, which can increase the likelihood of broken bones and osteoporosis.  Eating a well-balanced diet with plenty of calcium and vitamin D will help to protect your bones.  Weight-bearing and strength-building activities are also important for building and maintaining strong bones.  Bone mass can be measured with an X-ray test called a bone mineral density (BMD) test. This information is not intended to replace advice given to you by your health care provider. Make sure you discuss any questions you have with your health care provider. Document Revised: 09/28/2017 Document Reviewed: 09/28/2017 Elsevier Patient Education  2020 Elsevier Inc.   

## 2020-06-12 NOTE — Progress Notes (Signed)
1`    Gynecology Annual Exam  PCP: Maria Andersen, No Pcp Per  Chief Complaint:  Chief Complaint  Maria Andersen presents with  . Gynecologic Exam    annual exam    History of Present Illness: Maria Andersen is a 24 y.o. G0P0000 presents for annual exam. The Maria Andersen has no complaints today.   LMP: Maria Andersen's last menstrual period was 06/05/2020. Average Interval: regular, 28 days Duration of flow: 3 days Heavy Menses: no Clots: no Intermenstrual Bleeding: no Postcoital Bleeding: no Dysmenorrhea: no  The Maria Andersen is not currently sexually active. She currently uses OCP (estrogen/progesterone) for contraception. She denies dyspareunia.  The Maria Andersen does perform self breast exams.  There is no notable family history of breast or ovarian cancer in her family.  The Maria Andersen wears seatbelts: no.  The Maria Andersen has regular exercise: some exercise, difficult with school.    The Maria Andersen denies current symptoms of depression.    Review of Systems: Review of Systems  Constitutional: Negative for chills, fever, malaise/fatigue and weight loss.  HENT: Negative for congestion, hearing loss and sinus pain.   Eyes: Negative for blurred vision and double vision.  Respiratory: Negative for cough, sputum production, shortness of breath and wheezing.   Cardiovascular: Negative for chest pain, palpitations, orthopnea and leg swelling.  Gastrointestinal: Negative for abdominal pain, constipation, diarrhea, nausea and vomiting.  Genitourinary: Negative for dysuria, flank pain, frequency, hematuria and urgency.  Musculoskeletal: Negative for back pain, falls and joint pain.  Skin: Negative for itching and rash.  Neurological: Negative for dizziness and headaches.  Psychiatric/Behavioral: Negative for depression, substance abuse and suicidal ideas. The Maria Andersen is not nervous/anxious.     Past Medical History:  There are no problems to display for this Maria Andersen.   Past Surgical History:  Past Surgical History:    Procedure Laterality Date  . KNEE SURGERY    . TONSILLECTOMY AND ADENOIDECTOMY      Gynecologic History:  Maria Andersen's last menstrual period was 06/05/2020. Contraception: OCP (estrogen/progesterone) Last Pap: Results were: 2020  NIL and HR HPV negative   Obstetric History: G0P0000  Family History:  History reviewed. No pertinent family history.  Social History:  Social History   Socioeconomic History  . Marital status: Single    Spouse name: Not on file  . Number of children: Not on file  . Years of education: Not on file  . Highest education level: Not on file  Occupational History  . Not on file  Tobacco Use  . Smoking status: Never Smoker  . Smokeless tobacco: Never Used  Vaping Use  . Vaping Use: Never used  Substance and Sexual Activity  . Alcohol use: Yes  . Drug use: Never  . Sexual activity: Not Currently    Birth control/protection: Pill  Other Topics Concern  . Not on file  Social History Narrative  . Not on file   Social Determinants of Health   Financial Resource Strain:   . Difficulty of Paying Living Expenses: Not on file  Food Insecurity:   . Worried About Programme researcher, broadcasting/film/video in the Last Year: Not on file  . Ran Out of Food in the Last Year: Not on file  Transportation Needs:   . Lack of Transportation (Medical): Not on file  . Lack of Transportation (Non-Medical): Not on file  Physical Activity:   . Days of Exercise per Week: Not on file  . Minutes of Exercise per Session: Not on file  Stress:   . Feeling of Stress : Not on  file  Social Connections:   . Frequency of Communication with Friends and Family: Not on file  . Frequency of Social Gatherings with Friends and Family: Not on file  . Attends Religious Services: Not on file  . Active Member of Clubs or Organizations: Not on file  . Attends Banker Meetings: Not on file  . Marital Status: Not on file  Intimate Partner Violence:   . Fear of Current or Ex-Partner: Not on  file  . Emotionally Abused: Not on file  . Physically Abused: Not on file  . Sexually Abused: Not on file    Allergies:  No Known Allergies  Medications: Prior to Admission medications   Medication Sig Start Date End Date Taking? Authorizing Provider  norethindrone-ethinyl estradiol 1/35 (NORTREL 1/35, 28,) tablet Take 1 tablet by mouth daily. 06/12/20   Natale Milch, MD    Physical Exam Vitals: Blood pressure 114/70, pulse 64, resp. rate 18, height 5\' 5"  (1.651 m), weight 187 lb (84.8 kg), last menstrual period 06/05/2020, SpO2 98 %.  General: NAD HEENT: normocephalic, anicteric Thyroid: no enlargement, no palpable nodules Pulmonary: No increased work of breathing, CTAB Cardiovascular: RRR, distal pulses 2+ Breast: Breast symmetrical, no tenderness, no palpable nodules or masses, no skin or nipple retraction present, no nipple discharge.  No axillary or supraclavicular lymphadenopathy. Abdomen: NABS, soft, non-tender, non-distended.  Umbilicus without lesions.  No hepatomegaly, splenomegaly or masses palpable. No evidence of hernia  Genitourinary:  External: Normal external female genitalia.  Normal urethral meatus, normal Bartholin's and Skene's glands.    Vagina: Normal vaginal mucosa, no evidence of prolapse.    Cervix: Grossly normal in appearance, no bleeding  Uterus: Non-enlarged, mobile, normal contour.  No CMT  Adnexa: ovaries non-enlarged, no adnexal masses  Rectal: deferred  Lymphatic: no evidence of inguinal lymphadenopathy Extremities: no edema, erythema, or tenderness Neurologic: Grossly intact Psychiatric: mood appropriate, affect full  Female chaperone present for pelvic and breast  portions of the physical exam    Assessment: 24 y.o. G0P0000 routine annual exam  Plan: Problem List Items Addressed This Visit    None    Visit Diagnoses    Encounter for birth control pills maintenance       Relevant Medications   norethindrone-ethinyl estradiol  1/35 (NORTREL 1/35, 28,) tablet      1) STI screening  was offered and declined  2)  ASCCP guidelines and rational discussed.  Maria Andersen opts for every 3 years screening interval  3) Contraception - the Maria Andersen is currently using  OCP (estrogen/progesterone).  She is happy with her current form of contraception and plans to continue We discussed safe sex practices to reduce her furture risk of STI's.    4) Discussed general  healthy eating and healthy lifestyle  5) Depression and anxiety have improved. Maria Andersen continues to follow a therapist who has helped her significantly. She discontinued medications several months ago and continues to do well without medications.   6) Return in about 1 year (around 06/12/2021) for annual.  06/14/2021 MD, [facility], [facility] 06/12/2020 3:41 PM

## 2020-07-15 ENCOUNTER — Other Ambulatory Visit: Payer: Self-pay | Admitting: Obstetrics and Gynecology

## 2020-07-15 DIAGNOSIS — Z3041 Encounter for surveillance of contraceptive pills: Secondary | ICD-10-CM

## 2020-09-05 DIAGNOSIS — Z20822 Contact with and (suspected) exposure to covid-19: Secondary | ICD-10-CM | POA: Diagnosis not present

## 2020-09-05 DIAGNOSIS — Z03818 Encounter for observation for suspected exposure to other biological agents ruled out: Secondary | ICD-10-CM | POA: Diagnosis not present

## 2020-11-06 ENCOUNTER — Ambulatory Visit: Payer: BC Managed Care – PPO | Admitting: Obstetrics and Gynecology

## 2020-11-09 ENCOUNTER — Other Ambulatory Visit: Payer: Self-pay

## 2020-11-09 ENCOUNTER — Encounter: Payer: Self-pay | Admitting: Obstetrics and Gynecology

## 2020-11-09 ENCOUNTER — Ambulatory Visit (INDEPENDENT_AMBULATORY_CARE_PROVIDER_SITE_OTHER): Payer: BC Managed Care – PPO | Admitting: Obstetrics and Gynecology

## 2020-11-09 VITALS — BP 120/70 | Ht 65.0 in | Wt 188.4 lb

## 2020-11-09 DIAGNOSIS — R875 Abnormal microbiological findings in specimens from female genital organs: Secondary | ICD-10-CM

## 2020-11-09 DIAGNOSIS — Z113 Encounter for screening for infections with a predominantly sexual mode of transmission: Secondary | ICD-10-CM | POA: Diagnosis not present

## 2020-11-09 DIAGNOSIS — R300 Dysuria: Secondary | ICD-10-CM

## 2020-11-09 DIAGNOSIS — N3000 Acute cystitis without hematuria: Secondary | ICD-10-CM

## 2020-11-09 LAB — POCT URINALYSIS DIPSTICK
Bilirubin, UA: NEGATIVE
Blood, UA: POSITIVE
Glucose, UA: NEGATIVE
Ketones, UA: NEGATIVE
Nitrite, UA: POSITIVE
Protein, UA: NEGATIVE
Spec Grav, UA: 1.025 (ref 1.010–1.025)
Urobilinogen, UA: 0.2 E.U./dL
pH, UA: 5 (ref 5.0–8.0)

## 2020-11-09 MED ORDER — NITROFURANTOIN MONOHYD MACRO 100 MG PO CAPS: 100.0000 mg | ORAL_CAPSULE | Freq: Two times a day (BID) | ORAL | 0 refills | Status: AC

## 2020-11-09 NOTE — Progress Notes (Signed)
Patient ID: Maria Andersen, female   DOB: 1996/06/13, 25 y.o.   MRN: [id number]  Reason for Consult: Gynecologic Exam (Poss UTI x 4 days)   Referred by No ref. provider found  Subjective:     HPI:  Maria Andersen is a 25 y.o. female. She reports urinary discomfort for 4 days. Pain has been on an off with urination. She additionally requests STD testing today.    Past Medical History:  Diagnosis Date  . Anxiety   . Depression    No family history on file. Past Surgical History:  Procedure Laterality Date  . KNEE SURGERY    . TONSILLECTOMY AND ADENOIDECTOMY      Short Social History:  Social History   Tobacco Use  . Smoking status: Never Smoker  . Smokeless tobacco: Never Used  Substance Use Topics  . Alcohol use: Yes    No Known Allergies  Current Outpatient Medications  Medication Sig Dispense Refill  . NORTREL 1/35, 28, tablet TAKE 1 TABLET BY MOUTH DAILY 28 tablet 11   No current facility-administered medications for this visit.    Review of Systems  Constitutional: Negative for chills, fatigue, fever and unexpected weight change.  HENT: Negative for trouble swallowing.  Eyes: Negative for loss of vision.  Respiratory: Negative for cough, shortness of breath and wheezing.  Cardiovascular: Negative for chest pain, leg swelling, palpitations and syncope.  GI: Negative for abdominal pain, blood in stool, diarrhea, nausea and vomiting.  GU: Positive for dysuria and frequency. Negative for difficulty urinating and hematuria.  Musculoskeletal: Negative for back pain, leg pain and joint pain.  Skin: Negative for rash.  Neurological: Negative for dizziness, headaches, light-headedness, numbness and seizures.  Psychiatric: Negative for behavioral problem, confusion, depressed mood and sleep disturbance.        Objective:  Objective   Vitals:   11/09/20 1124  BP: 120/70  Weight: 188 lb 6.4 oz (85.5 kg)  Height: 5\' 5"  (1.651 m)   Body mass index is 31.35  kg/m.  Physical Exam Vitals and nursing note reviewed.  Constitutional:      Appearance: She is well-developed and well-nourished.  HENT:     Head: Normocephalic and atraumatic.  Eyes:     Extraocular Movements: EOM normal.     Pupils: Pupils are equal, round, and reactive to light.  Cardiovascular:     Rate and Rhythm: Normal rate and regular rhythm.  Pulmonary:     Effort: Pulmonary effort is normal. No respiratory distress.  Genitourinary:    General: Normal vulva.     Comments: swab collected Skin:    General: Skin is warm and dry.  Neurological:     Mental Status: She is alert and oriented to person, place, and time.  Psychiatric:        Mood and Affect: Mood and affect normal.        Behavior: Behavior normal.        Thought Content: Thought content normal.        Judgment: Judgment normal.     Assessment/Plan:     25 yo with dysuria UA suggestive of UTI. Will treat with macrobid.  Will check nuswab for infection  More than 10 minutes were spent face to face with the patient in the room, reviewing the medical record, labs and images, and coordinating care for the patient. The plan of management was discussed in detail and counseling was provided.     21 MD [facility], [facility]  11/09/2020 11:43 AM

## 2020-11-09 NOTE — Patient Instructions (Signed)
Urinary Tract Infection, Adult  A urinary tract infection (UTI) is an infection of any part of the urinary tract. The urinary tract includes the kidneys, ureters, bladder, and urethra. These organs make, store, and get rid of urine in the body. An upper UTI affects the ureters and kidneys. A lower UTI affects the bladder and urethra. What are the causes? Most urinary tract infections are caused by bacteria in your genital area around your urethra, where urine leaves your body. These bacteria grow and cause inflammation of your urinary tract. What increases the risk? You are more likely to develop this condition if:  You have a urinary catheter that stays in place.  You are not able to control when you urinate or have a bowel movement (incontinence).  You are female and you: ? Use a spermicide or diaphragm for birth control. ? Have low estrogen levels. ? Are pregnant.  You have certain genes that increase your risk.  You are sexually active.  You take antibiotic medicines.  You have a condition that causes your flow of urine to slow down, such as: ? An enlarged prostate, if you are female. ? Blockage in your urethra. ? A kidney stone. ? A nerve condition that affects your bladder control (neurogenic bladder). ? Not getting enough to drink, or not urinating often.  You have certain medical conditions, such as: ? Diabetes. ? A weak disease-fighting system (immunesystem). ? Sickle cell disease. ? Gout. ? Spinal cord injury. What are the signs or symptoms? Symptoms of this condition include:  Needing to urinate right away (urgency).  Frequent urination. This may include small amounts of urine each time you urinate.  Pain or burning with urination.  Blood in the urine.  Urine that smells bad or unusual.  Trouble urinating.  Cloudy urine.  Vaginal discharge, if you are female.  Pain in the abdomen or the lower back. You may also have:  Vomiting or a decreased  appetite.  Confusion.  Irritability or tiredness.  A fever or chills.  Diarrhea. The first symptom in older adults may be confusion. In some cases, they may not have any symptoms until the infection has worsened. How is this diagnosed? This condition is diagnosed based on your medical history and a physical exam. You may also have other tests, including:  Urine tests.  Blood tests.  Tests for STIs (sexually transmitted infections). If you have had more than one UTI, a cystoscopy or imaging studies may be done to determine the cause of the infections. How is this treated? Treatment for this condition includes:  Antibiotic medicine.  Over-the-counter medicines to treat discomfort.  Drinking enough water to stay hydrated. If you have frequent infections or have other conditions such as a kidney stone, you may need to see a health care provider who specializes in the urinary tract (urologist). In rare cases, urinary tract infections can cause sepsis. Sepsis is a life-threatening condition that occurs when the body responds to an infection. Sepsis is treated in the hospital with IV antibiotics, fluids, and other medicines. Follow these instructions at home: Medicines  Take over-the-counter and prescription medicines only as told by your health care provider.  If you were prescribed an antibiotic medicine, take it as told by your health care provider. Do not stop using the antibiotic even if you start to feel better. General instructions  Make sure you: ? Empty your bladder often and completely. Do not hold urine for long periods of time. ? Empty your bladder after   sex. ? Wipe from front to back after urinating or having a bowel movement if you are female. Use each tissue only one time when you wipe.  Drink enough fluid to keep your urine pale yellow.  Keep all follow-up visits. This is important.   Contact a health care provider if:  Your symptoms do not get better after 1-2  days.  Your symptoms go away and then return. Get help right away if:  You have severe pain in your back or your lower abdomen.  You have a fever or chills.  You have nausea or vomiting. Summary  A urinary tract infection (UTI) is an infection of any part of the urinary tract, which includes the kidneys, ureters, bladder, and urethra.  Most urinary tract infections are caused by bacteria in your genital area.  Treatment for this condition often includes antibiotic medicines.  If you were prescribed an antibiotic medicine, take it as told by your health care provider. Do not stop using the antibiotic even if you start to feel better.  Keep all follow-up visits. This is important. This information is not intended to replace advice given to you by your health care provider. Make sure you discuss any questions you have with your health care provider. Document Revised: 04/13/2020 Document Reviewed: 04/13/2020 Elsevier Patient Education  2021 Elsevier Inc. Dysuria Dysuria is pain or discomfort during urination. The pain or discomfort may be felt in the part of the body that drains urine from the bladder (urethra) or in the surrounding tissue of the genitals. The pain may also be felt in the groin area, lower abdomen, or lower back. You may have to urinate frequently or have the sudden feeling that you have to urinate (urgency). Dysuria can affect anyone, but it is more common in females. Dysuria can be caused by many different things, including:  Urinary tract infection.  Kidney stones or bladder stones.  Certain STIs (sexually transmitted infections), such as chlamydia.  Dehydration.  Inflammation of the tissues of the vagina.  Use of certain medicines.  Use of certain soaps or scented products that cause irritation. Follow these instructions at home: Medicines  Take over-the-counter and prescription medicines only as told by your health care provider.  If you were  prescribed an antibiotic medicine, take it as told by your health care provider. Do not stop taking the antibiotic even if you start to feel better. Eating and drinking  Drink enough fluid to keep your urine pale yellow.  Avoid caffeinated beverages, tea, and alcohol. These beverages can irritate the bladder and make dysuria worse. In males, alcohol may irritate the prostate.   General instructions  Watch your condition for any changes.  Urinate often. Avoid holding urine for long periods of time.  If you are female, you should wipe from front to back after urinating or having a bowel movement. Use each piece of toilet paper only once.  Empty your bladder after sex.  Keep all follow-up visits. This is important.  If you had any tests done to find the cause of dysuria, it is up to you to get your test results. Ask your health care provider, or the department that is doing the test, when your results will be ready. Contact a health care provider if:  You have a fever.  You develop pain in your back or sides.  You have nausea or vomiting.  You have blood in your urine.  You are not urinating as often as you usually do. Get  help right away if:  Your pain is severe and not relieved with medicines.  You cannot eat or drink without vomiting.  You are confused.  You have a rapid heartbeat while resting.  You have shaking or chills.  You feel extremely weak. Summary  Dysuria is pain or discomfort while urinating. Many different conditions can lead to dysuria.  If you have dysuria, you may have to urinate frequently or have the sudden feeling that you have to urinate (urgency).  Watch your condition for any changes. Keep all follow-up visits.  Make sure that you urinate often and drink enough fluid to keep your urine pale yellow. This information is not intended to replace advice given to you by your health care provider. Make sure you discuss any questions you have with  your health care provider. Document Revised: 04/13/2020 Document Reviewed: 04/13/2020 Elsevier Patient Education  2021 [employer].

## 2020-11-12 ENCOUNTER — Other Ambulatory Visit: Payer: Self-pay | Admitting: Obstetrics and Gynecology

## 2020-11-12 DIAGNOSIS — B3731 Acute candidiasis of vulva and vagina: Secondary | ICD-10-CM

## 2020-11-12 DIAGNOSIS — B373 Candidiasis of vulva and vagina: Secondary | ICD-10-CM

## 2020-11-12 LAB — URINE CULTURE

## 2020-11-12 LAB — NUSWAB VAGINITIS PLUS (VG+)
Candida albicans, NAA: POSITIVE — AB
Candida glabrata, NAA: NEGATIVE
Chlamydia trachomatis, NAA: NEGATIVE
Neisseria gonorrhoeae, NAA: NEGATIVE
Trich vag by NAA: NEGATIVE

## 2020-11-12 MED ORDER — FLUCONAZOLE 150 MG PO TABS
150.0000 mg | ORAL_TABLET | ORAL | 0 refills | Status: AC
Start: 2020-11-12 — End: 2020-11-16

## 2020-11-15 ENCOUNTER — Telehealth: Payer: Self-pay

## 2020-11-15 NOTE — Telephone Encounter (Signed)
Pt calling for test results; is having issues with MyChart.  [phone]  Mailbox not set up yet.

## 2020-11-15 NOTE — Telephone Encounter (Signed)
I called her back. She did not answer. Voicemail not set up. I sent her rx already to the pharmacy and sent her a mychart message. You can tell her about the results if she calls back.

## 2020-11-16 NOTE — Telephone Encounter (Signed)
Pt left msg to be called back.  Spoke c pt; read CRS's note to her and gave her the number for help c MyChart.

## 2021-01-09 NOTE — Telephone Encounter (Signed)
completed

## 2021-02-25 ENCOUNTER — Ambulatory Visit: Payer: BC Managed Care – PPO | Admitting: Obstetrics and Gynecology

## 2021-02-26 ENCOUNTER — Telehealth: Payer: Self-pay

## 2021-02-26 ENCOUNTER — Ambulatory Visit: Payer: BC Managed Care – PPO | Admitting: Obstetrics

## 2021-02-26 ENCOUNTER — Encounter: Payer: Self-pay | Admitting: Obstetrics

## 2021-02-26 ENCOUNTER — Other Ambulatory Visit: Payer: Self-pay

## 2021-02-26 DIAGNOSIS — N6320 Unspecified lump in the left breast, unspecified quadrant: Secondary | ICD-10-CM | POA: Diagnosis not present

## 2021-02-26 NOTE — Telephone Encounter (Signed)
Copied from CRM [id number]. Topic: Appointment Scheduling - Scheduling Inquiry for Clinic >> Feb 26, 2021 11:21 AM Gaetana Michaelis A wrote: Reason for CRM: Patient's mother would like for them to be a new patient of Dr. Sullivan Lone  Patient's mother was made aware that Dr. Sullivan Lone is not accepting new patient's but instructed agent to "tell him it's [place]" shared that "If you don't tell him, I'll call him personally and tell him to come to my house" and was assured that "he will make exceptions for [place]"  Please contact to advise when possible

## 2021-02-26 NOTE — Progress Notes (Signed)
Obstetrics & Gynecology Office Visit   Chief Complaint:  Chief Complaint  Patient presents with   Breast Exam    Small lump above nipple on left breast only x 1 month   Body Rash    Both sides of body going down both legs, not itchy x 1 week    History of Present Illness: Maria Andersen presents for evaluation of a small mass noticed on her left areola. She noticed this several weeks ago. She is an [employer] grad who is now working. She is not presently sexually active. She uses Nortrel OCPs for contraception. She denies any nipple discharge. Slo denies any family history of breast Cancer.   Review of Systems:  Review of Systems  Constitutional: Negative.   HENT: Negative.    Eyes: Negative.   Respiratory: Negative.    Cardiovascular: Negative.   Gastrointestinal: Negative.   Genitourinary: Negative.   Skin:  Positive for rash.       She has noticed several areas on the backs of both legs and on her left side that have round slightly red rash areas.  Neurological: Negative.   Endo/Heme/Allergies: Negative.   Psychiatric/Behavioral: Negative.      Past Medical History:  Past Medical History:  Diagnosis Date   Anxiety    Depression     Past Surgical History:  Past Surgical History:  Procedure Laterality Date   KNEE SURGERY     TONSILLECTOMY AND ADENOIDECTOMY      Gynecologic History: Patient's last menstrual period was 02/04/2021 (approximate).  Obstetric History: G0P0000  Family History:  History reviewed. No pertinent family history.  Social History:  Social History   Socioeconomic History   Marital status: Single    Spouse name: Not on file   Number of children: Not on file   Years of education: Not on file   Highest education level: Not on file  Occupational History   Not on file  Tobacco Use   Smoking status: Never   Smokeless tobacco: Never  Vaping Use   Vaping Use: Never used  Substance and Sexual Activity   Alcohol use: Yes   Drug use: Never    Sexual activity: Not Currently    Birth control/protection: Pill  Other Topics Concern   Not on file  Social History Narrative   Not on file   Social Determinants of Health   Financial Resource Strain: Not on file  Food Insecurity: Not on file  Transportation Needs: Not on file  Physical Activity: Not on file  Stress: Not on file  Social Connections: Not on file  Intimate Partner Violence: Not on file    Allergies:  No Known Allergies  Medications: Prior to Admission medications   Medication Sig Start Date End Date Taking? Authorizing Provider  NORTREL 1/35, 28, tablet TAKE 1 TABLET BY MOUTH DAILY 07/16/20  Yes Schuman, Jaquelyn Bitter, MD    Physical Exam Vitals:  Vitals:   02/26/21 0949  BP: 90/60   Patient's last menstrual period was 02/04/2021 (approximate).  Physical Exam Vitals reviewed.  Constitutional:      Appearance: Normal appearance. She is normal weight.  HENT:     Head: Normocephalic and atraumatic.     Nose: Nose normal.  Cardiovascular:     Rate and Rhythm: Normal rate and regular rhythm.     Pulses: Normal pulses.     Heart sounds: Normal heart sounds.  Pulmonary:     Effort: Pulmonary effort is normal.     Breath sounds:  Normal breath sounds.  Abdominal:     Palpations: Abdomen is soft.  Genitourinary:    Comments: Exam deferred. Here for breast evaluation. Musculoskeletal:        General: Normal range of motion.     Cervical back: Normal range of motion and neck supple.  Skin:    General: Skin is warm and dry.     Findings: Rash present.     Comments: Three areas of rash, her left side, and her back upper legs show areas of slightly raised bumps.  Neurological:     General: No focal deficit present.     Mental Status: She is alert and oriented to person, place, and time.  Psychiatric:        Mood and Affect: Mood normal.        Behavior: Behavior normal.   Breast exam: breasts are c cup size, with bilateral pierced nipples. No masses  noted on right breast, no fibrocystic findings. At the upper margin of her left areola is a slight thickening of the tissue. No clearly delineated nodule noted. The tissue is also not mobile, and located close to the piercing  Assessment: 25 y.o. G0P0000 irregular breast tissue thickening on left areola.   Plan: Problem List Items Addressed This Visit       Other   Left breast lump  We discussed the strong possibility that this area is scarring from the nipple piercing, and might represent a previous small infection from when she had the piercing performed. Offer a mammogram and diagnostic ultrasound of the breast.  She will monitor the area, and follow up with [facility] should there be any changes or increase in the size of the irregularity. She will contact the provider for an orde rfor mammo should she decide to proceed with further screening.  Mirna Mires, CNM  02/26/2021 6:31 PM

## 2021-03-05 ENCOUNTER — Ambulatory Visit: Payer: BC Managed Care – PPO | Admitting: Family Medicine

## 2021-03-05 ENCOUNTER — Encounter: Payer: Self-pay | Admitting: Family Medicine

## 2021-03-05 ENCOUNTER — Other Ambulatory Visit: Payer: Self-pay

## 2021-03-05 VITALS — BP 112/83 | HR 62 | Resp 16 | Ht 65.0 in | Wt 179.0 lb

## 2021-03-05 DIAGNOSIS — Z23 Encounter for immunization: Secondary | ICD-10-CM

## 2021-03-05 DIAGNOSIS — J309 Allergic rhinitis, unspecified: Secondary | ICD-10-CM | POA: Diagnosis not present

## 2021-03-05 MED ORDER — CETIRIZINE HCL 10 MG PO TABS: 10.0000 mg | ORAL_TABLET | Freq: Every day | ORAL | 5 refills | Status: DC

## 2021-03-05 NOTE — Progress Notes (Signed)
I,April Miller,acting as a scribe for Megan Mans, MD.,have documented all relevant documentation on the behalf of Megan Mans, MD,as directed by  Megan Mans, MD while in the presence of Megan Mans, MD.  New patient visit   Patient: Maria Andersen   DOB: 1995-10-29   25 y.o. Female  MRN: [id number] Visit Date: 03/05/2021  Today's healthcare provider: Megan Mans, MD   Chief Complaint  Patient presents with   Establish Care   Subjective    Maria Andersen is a 25 y.o. female who presents today as a new patient to establish care.  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  Rash described below has now essentially resolved. Overall she feels well.  He is on Nortrel birth control pills only.  She uses no street drugs does not smoke .  Overall she feels well. HPI  Patient has a rash on the back of upper legs and her sides. She has had rash for over one week. Patient states she took benadryl with no relief. Rash does not itch.    Past Medical History:  Diagnosis Date   Anxiety    Depression    Past Surgical History:  Procedure Laterality Date   KNEE SURGERY     TONSILLECTOMY AND ADENOIDECTOMY     No family status information on file.   History reviewed. No pertinent family history. Social History   Socioeconomic History   Marital status: Single    Spouse name: Not on file   Number of children: Not on file   Years of education: Not on file   Highest education level: Not on file  Occupational History   Not on file  Tobacco Use   Smoking status: Never   Smokeless tobacco: Never  Vaping Use   Vaping Use: Never used  Substance and Sexual Activity   Alcohol use: Yes   Drug use: Never   Sexual activity: Not Currently    Birth control/protection: Pill  Other Topics Concern   Not on file  Social History Narrative   Not on file   Social Determinants of Health   Financial Resource Strain: Not on file  Food Insecurity: Not on file   Transportation Needs: Not on file  Physical Activity: Not on file  Stress: Not on file  Social Connections: Not on file   Outpatient Medications Prior to Visit  Medication Sig   NORTREL 1/35, 28, tablet TAKE 1 TABLET BY MOUTH DAILY   No facility-administered medications prior to visit.   No Known Allergies   There is no immunization history on file for this patient.  Health Maintenance  Topic Date Due   COVID-19 Vaccine (1) Never done   HPV VACCINES (1 - 2-dose series) Never done   HIV Screening  Never done   Hepatitis C Screening  Never done   TETANUS/TDAP  Never done   INFLUENZA VACCINE  04/15/2021   PAP-Cervical Cytology Screening  05/18/2022   PAP SMEAR-Modifier  05/18/2022   Pneumococcal Vaccine 64-67 Years old  Aged Out    Patient Care Team: Maple Hudson., MD as PCP - General (Family Medicine)  Review of Systems  Constitutional:  Positive for unexpected weight change.  Skin:  Positive for rash.  All other systems reviewed and are negative.     Objective    BP 112/83 (BP Location: Right Arm, Patient Position: Sitting, Cuff Size: Large)   Pulse 62   Resp 16   Ht  5\' 5"  (1.651 m)   Wt 179 lb (81.2 kg)   LMP 02/04/2021 (Approximate)   SpO2 98%   BMI 29.79 kg/m  Physical Exam Vitals reviewed.  HENT:     Head: Normocephalic and atraumatic.     Right Ear: External ear normal.     Left Ear: External ear normal.     Nose: Nose normal.     Mouth/Throat:     Pharynx: Oropharynx is clear.  Eyes:     General: No scleral icterus.    Conjunctiva/sclera: Conjunctivae normal.  Cardiovascular:     Rate and Rhythm: Normal rate and regular rhythm.     Pulses: Normal pulses.     Heart sounds: Normal heart sounds.  Pulmonary:     Effort: Pulmonary effort is normal.     Breath sounds: Normal breath sounds.  Abdominal:     Palpations: Abdomen is soft.  Skin:    General: Skin is warm and dry.     Capillary Refill: Capillary refill takes less than 2  seconds.     Findings: No rash.  Neurological:     General: No focal deficit present.     Mental Status: She is alert and oriented to person, place, and time.  Psychiatric:        Mood and Affect: Mood normal.        Behavior: Behavior normal.        Thought Content: Thought content normal.        Judgment: Judgment normal.     Depression Screen PHQ 2/9 Scores 03/05/2021 11/09/2020 06/12/2020 05/19/2019  PHQ - 2 Score 0 0 0 5  PHQ- 9 Score 2 3 5 19    No results found for any visits on 03/05/21.  Assessment & Plan      1. Allergic rhinitis, unspecified seasonality, unspecified trigger Zyrtec as it might be an allergic rash.  No further treatment or intervention indicated at this time. - cetirizine (ZYRTEC) 10 MG tablet; Take 1 tablet (10 mg total) by mouth daily.  Dispense: 30 tablet; Refill: 5  2. Need for Tdap vaccination Update tetanus status is is not anywhere in old records.  Follow-up as needed. - Tdap vaccine greater than or equal to 7yo IM  No follow-ups on file.     I, , MD, have reviewed all documentation for this visit. The documentation on 03/10/21 for the exam, diagnosis, procedures, and orders are all accurate and complete.    Bernese Doffing Megan Mans, MD  [facility] [phone] (phone) [phone] (fax)  [facility]

## 2021-06-14 ENCOUNTER — Other Ambulatory Visit (HOSPITAL_COMMUNITY)
Admission: RE | Admit: 2021-06-14 | Discharge: 2021-06-14 | Disposition: A | Discharge status: Home / Self Care | Payer: BC Managed Care – PPO | Source: Ambulatory Visit | Attending: Obstetrics and Gynecology | Admitting: Obstetrics and Gynecology

## 2021-06-14 ENCOUNTER — Encounter: Payer: Self-pay | Admitting: Obstetrics and Gynecology

## 2021-06-14 ENCOUNTER — Ambulatory Visit (INDEPENDENT_AMBULATORY_CARE_PROVIDER_SITE_OTHER): Payer: BC Managed Care – PPO | Admitting: Obstetrics and Gynecology

## 2021-06-14 ENCOUNTER — Other Ambulatory Visit: Payer: Self-pay

## 2021-06-14 VITALS — BP 116/72 | Ht 65.0 in | Wt 186.4 lb

## 2021-06-14 DIAGNOSIS — Z124 Encounter for screening for malignant neoplasm of cervix: Secondary | ICD-10-CM

## 2021-06-14 DIAGNOSIS — Z0000 Encounter for general adult medical examination without abnormal findings: Secondary | ICD-10-CM | POA: Diagnosis not present

## 2021-06-14 DIAGNOSIS — F411 Generalized anxiety disorder: Secondary | ICD-10-CM

## 2021-06-14 DIAGNOSIS — Z01419 Encounter for gynecological examination (general) (routine) without abnormal findings: Secondary | ICD-10-CM | POA: Diagnosis not present

## 2021-06-14 DIAGNOSIS — Z113 Encounter for screening for infections with a predominantly sexual mode of transmission: Secondary | ICD-10-CM | POA: Insufficient documentation

## 2021-06-14 DIAGNOSIS — Z1239 Encounter for other screening for malignant neoplasm of breast: Secondary | ICD-10-CM | POA: Diagnosis not present

## 2021-06-14 DIAGNOSIS — Z3041 Encounter for surveillance of contraceptive pills: Secondary | ICD-10-CM

## 2021-06-14 MED ORDER — BUSPIRONE HCL 5 MG PO TABS: 5.0000 mg | ORAL_TABLET | Freq: Three times a day (TID) | ORAL | 3 refills | Status: DC

## 2021-06-14 MED ORDER — NORTREL 1/35 (28) 1-35 MG-MCG PO TABS: 1.0000 | ORAL_TABLET | Freq: Every day | ORAL | 11 refills | Status: DC

## 2021-06-14 MED ORDER — CITALOPRAM HYDROBROMIDE 20 MG PO TABS
20.0000 mg | ORAL_TABLET | Freq: Every day | ORAL | 3 refills | Status: DC
Start: 2021-06-14 — End: 2021-10-18

## 2021-06-14 NOTE — Patient Instructions (Addendum)
Institute of Medicine Recommended Dietary Allowances for Calcium and Vitamin D  Age (yr) Calcium Recommended Dietary Allowance (mg/day) Vitamin D Recommended Dietary Allowance (international units/day)  9-18 1,300 600  19-50 1,000 600  51-70 1,200 600  71 and older 1,200 800  Data from Institute of Medicine. Dietary reference intakes: calcium, vitamin D. [place], [place]: National Academies Press; 2011.   Exercising to Stay Healthy To become healthy and stay healthy, it is recommended that you do moderate-intensity and vigorous-intensity exercise. You can tell that you are exercising at a moderate intensity if your heart starts beating faster and you start breathing faster but can still hold a conversation. You can tell that you are exercising at a vigorous intensity if you are breathing much harder and faster and cannot hold a conversation while exercising. How can exercise benefit me? Exercising regularly is important. It has many health benefits, such as: Improving overall fitness, flexibility, and endurance. Increasing bone density. Helping with weight control. Decreasing body fat. Increasing muscle strength and endurance. Reducing stress and tension, anxiety, depression, or anger. Improving overall health. What guidelines should I follow while exercising? Before you start a new exercise program, talk with your health care provider. Do not exercise so much that you hurt yourself, feel dizzy, or get very short of breath. Wear comfortable clothes and wear shoes with good support. Drink plenty of water while you exercise to prevent dehydration or heat stroke. Work out until your breathing and your heartbeat get faster (moderate intensity). How often should I exercise? Choose an activity that you enjoy, and set realistic goals. Your health care provider can help you make an activity plan that is individually designed and works best for you. Exercise regularly as told by your health  care provider. This may include: Doing strength training two times a week, such as: Lifting weights. Using resistance bands. Push-ups. Sit-ups. Yoga. Doing a certain intensity of exercise for a given amount of time. Choose from these options: A total of 150 minutes of moderate-intensity exercise every week. A total of 75 minutes of vigorous-intensity exercise every week. A mix of moderate-intensity and vigorous-intensity exercise every week. Children, pregnant women, people who have not exercised regularly, people who are overweight, and older adults may need to talk with a health care provider about what activities are safe to perform. If you have a medical condition, be sure to talk with your health care provider before you start a new exercise program. What are some exercise ideas? Moderate-intensity exercise ideas include: Walking 1 mile (1.6 km) in about 15 minutes. Biking. Hiking. Golfing. Dancing. Water aerobics. Vigorous-intensity exercise ideas include: Walking 4.5 miles (7.2 km) or more in about 1 hour. Jogging or running 5 miles (8 km) in about 1 hour. Biking 10 miles (16.1 km) or more in about 1 hour. Lap swimming. Roller-skating or in-line skating. Cross-country skiing. Vigorous competitive sports, such as football, basketball, and soccer. Jumping rope. Aerobic dancing. What are some everyday activities that can help me get exercise? Yard work, such as: Pushing a lawn mower. Raking and bagging leaves. Washing your car. Pushing a stroller. Shoveling snow. Gardening. Washing windows or floors. How can I be more active in my day-to-day activities? Use stairs instead of an elevator. Take a walk during your lunch break. If you drive, park your car farther away from your work or school. If you take public transportation, get off one stop early and walk the rest of the way. Stand up or walk around during all of   your indoor phone calls. Get up, stretch, and walk  around every 30 minutes throughout the day. Enjoy exercise with a friend. Support to continue exercising will help you keep a regular routine of activity. Where to find more information You can find more information about exercising to stay healthy from: U.S. Department of Health and Human Services: www.hhs.gov Centers for Disease Control and Prevention (CDC): www.cdc.gov Summary Exercising regularly is important. It will improve your overall fitness, flexibility, and endurance. Regular exercise will also improve your overall health. It can help you control your weight, reduce stress, and improve your bone density. Do not exercise so much that you hurt yourself, feel dizzy, or get very short of breath. Before you start a new exercise program, talk with your health care provider. This information is not intended to replace advice given to you by your health care provider. Make sure you discuss any questions you have with your health care provider. Document Revised: 12/28/2020 Document Reviewed: 12/28/2020 Elsevier Patient Education  2022 Elsevier Inc. Budget-Friendly Healthy Eating There are many ways to save money at the grocery store and continue to eat healthy. You can be successful if you: Plan meals according to your budget. Make a grocery list and only purchase food according to your grocery list. Prepare food yourself at home. What are tips for following this plan? Reading food labels Compare food labels between brand name foods and the store brand. Often the nutritional value is the same, but the store brand is lower cost. Look for products that do not have added sugar, fat, or salt (sodium). These often cost the same but are healthier for you. Products may be labeled as: Sugar-free. Nonfat. Low-fat. Sodium-free. Low-sodium. Look for lean ground beef labeled as at least 92% lean and 8% fat. Shopping  Buy only the items on your grocery list and go only to the areas of the store  that have the items on your list. Use coupons only for foods and brands you normally buy. Avoid buying items you wouldn't normally buy simply because they are on sale. Check online and in newspapers for weekly deals. Buy healthy items from the bulk bins when available, such as herbs, spices, flour, pasta, nuts, and dried fruit. Buy fruits and vegetables that are in season. Prices are usually lower on in-season produce. Look at the unit price on the price tag. Use it to compare different brands and sizes to find out which item is the best deal. Choose healthy items that are often low-cost, such as carrots, potatoes, apples, bananas, and oranges. Dried or canned beans are a low-cost protein source. Buy in bulk and freeze extra food. Items you can buy in bulk include meats, fish, poultry, frozen fruits, and frozen vegetables. Avoid buying "ready-to-eat" foods, such as pre-cut fruits and vegetables and pre-made salads. If possible, shop around to discover where you can find the best prices. Consider other retailers such as dollar stores, larger wholesale stores, local fruit and vegetable stands, and farmers markets. Do not shop when you are hungry. If you shop while hungry, it may be hard to stick to your list and budget. Resist impulse buying. Use your grocery list as your official plan for the week. Buy a variety of vegetables and fruits by purchasing fresh, frozen, and canned items. Look at the top and bottom shelves for deals. Foods at eye level (eye level of an adult or child) are usually more expensive. Be efficient with your time when shopping. The more time you   spend at the store, the more money you are likely to spend. To save money when choosing more expensive foods like meats and dairy: Choose cheaper cuts of meat, such as bone-in chicken thighs and drumsticks instead of skinless and boneless chicken. When you are ready to prepare the chicken, you can remove the skin yourself to make it  healthier. Choose lean meats like chicken or [place] instead of beef. Choose canned seafood, such as tuna, salmon, or sardines. Buy eggs as a low-cost source of protein. Buy dried beans and peas, such as lentils, split peas, or kidney beans instead of meats. Dried beans and peas are a good alternative source of protein. Buy the larger tubs of yogurt instead of individual-sized containers. Choose water instead of sodas and other sweetened beverages. Avoid buying chips, cookies, and other "junk food." These items are usually expensive and not healthy. Cooking Make extra food and freeze the extras in meal-sized containers or in individual portions for fast meals and snacks. Pre-cook on days when you have extra time to prepare meals in advance. You can keep these meals in the fridge or freezer and reheat for a quick meal. When you come home from the grocery store, wash, peel, and cut fruits and vegetables so they are ready to use and eat. This will help reduce food waste. Meal planning Do not eat out or get fast food. Prepare food at home. Make a grocery list and make sure to bring it with you to the store. If you have a smart phone, you could use your phone to create your shopping list. Plan meals and snacks according to a grocery list and budget you create. Use leftovers in your meal plan for the week. Look for recipes where you can cook once and make enough food for two meals. Prepare budget-friendly types of meals like stews, casseroles, and stir-fry dishes. Try some meatless meals or try "no cook" meals like salads. Make sure that half your plate is filled with fruits or vegetables. Choose from fresh, frozen, or canned fruits and vegetables. If eating canned, remember to rinse them before eating. This will remove any excess salt added for packaging. Summary Eating healthy on a budget is possible if you plan your meals according to your budget, purchase according to your budget and grocery list,  and prepare food yourself. Tips for buying more food on a limited budget include buying generic brands, using coupons only for foods you normally buy, and buying healthy items from the bulk bins when available. Tips for buying cheaper food to replace expensive food include choosing cheaper, lean cuts of meat, and buying dried beans and peas. This information is not intended to replace advice given to you by your health care provider. Make sure you discuss any questions you have with your health care provider. Document Revised: 06/14/2020 Document Reviewed: 06/14/2020 Elsevier Patient Education  2022 Elsevier Inc. Managing Anxiety, Adult After being diagnosed with an anxiety disorder, you may be relieved to know why you have felt or behaved a certain way. You may also feel overwhelmed about the treatment ahead and what it will mean for your life. With care and support, you can manage this condition and recover from it. How to manage lifestyle changes Managing stress and anxiety Stress is your body's reaction to life changes and events, both good and bad. Most stress will last just a few hours, but stress can be ongoing and can lead to more than just stress. Although stress can play a  major role in anxiety, it is not the same as anxiety. Stress is usually caused by something external, such as a deadline, test, or competition. Stress normally passes after the triggering event has ended.  Anxiety is caused by something internal, such as imagining a terrible outcome or worrying that something will go wrong that will devastate you. Anxiety often does not go away even after the triggering event is over, and it can become long-term (chronic) worry. It is important to understand the differences between stress and anxiety and to manage your stress effectively so that it does not lead to an anxious response. Talk with your health care provider or a counselor to learn more about reducing anxiety and stress. He or  she may suggest tension reduction techniques, such as: Music therapy. This can include creating or listening to music that you enjoy and that inspires you. Mindfulness-based meditation. This involves being aware of your normal breaths while not trying to control your breathing. It can be done while sitting or walking. Centering prayer. This involves focusing on a word, phrase, or sacred image that means something to you and brings you peace. Deep breathing. To do this, expand your stomach and inhale slowly through your nose. Hold your breath for 3-5 seconds. Then exhale slowly, letting your stomach muscles relax. Self-talk. This involves identifying thought patterns that lead to anxiety reactions and changing those patterns. Muscle relaxation. This involves tensing muscles and then relaxing them. Choose a tension reduction technique that suits your lifestyle and personality. These techniques take time and practice. Set aside 5-15 minutes a day to do them. Therapists can offer counseling and training in these techniques. The training to help with anxiety may be covered by some insurance plans. Other things you can do to manage stress and anxiety include: Keeping a stress/anxiety diary. This can help you learn what triggers your reaction and then learn ways to manage your response. Thinking about how you react to certain situations. You may not be able to control everything, but you can control your response. Making time for activities that help you relax and not feeling guilty about spending your time in this way. Visual imagery and yoga can help you stay calm and relax.  Medicines Medicines can help ease symptoms. Medicines for anxiety include: Anti-anxiety drugs. Antidepressants. Medicines are often used as a primary treatment for anxiety disorder. Medicines will be prescribed by a health care provider. When used together, medicines, psychotherapy, and tension reduction techniques may be the most  effective treatment. Relationships Relationships can play a big part in helping you recover. Try to spend more time connecting with trusted friends and family members. Consider going to couples counseling, taking family education classes, or going to family therapy. Therapy can help you and others better understand your condition. How to recognize changes in your anxiety Everyone responds differently to treatment for anxiety. Recovery from anxiety happens when symptoms decrease and stop interfering with your daily activities at home or work. This may mean that you will start to: Have better concentration and focus. Worry will interfere less in your daily thinking. Sleep better. Be less irritable. Have more energy. Have improved memory. It is important to recognize when your condition is getting worse. Contact your health care provider if your symptoms interfere with home or work and you feel like your condition is not improving. Follow these instructions at home: Activity Exercise. Most adults should do the following: Exercise for at least 150 minutes each week. The exercise should increase  your heart rate and make you sweat (moderate-intensity exercise). Strengthening exercises at least twice a week. Get the right amount and quality of sleep. Most adults need 7-9 hours of sleep each night. Lifestyle  Eat a healthy diet that includes plenty of vegetables, fruits, whole grains, low-fat dairy products, and lean protein. Do not eat a lot of foods that are high in solid fats, added sugars, or salt. Make choices that simplify your life. Do not use any products that contain nicotine or tobacco, such as cigarettes, e-cigarettes, and chewing tobacco. If you need help quitting, ask your health care provider. Avoid caffeine, alcohol, and certain over-the-counter cold medicines. These may make you feel worse. Ask your pharmacist which medicines to avoid. General instructions Take over-the-counter and  prescription medicines only as told by your health care provider. Keep all follow-up visits as told by your health care provider. This is important. Where to find support You can get help and support from these sources: Self-help groups. Online and [employer]. A trusted spiritual leader. Couples counseling. Family education classes. Family therapy. Where to find more information You may find that joining a support group helps you deal with your anxiety. The following sources can help you locate counselors or support groups near you: Mental Health America: www.mentalhealthamerica.net Anxiety and Depression Association of [place] (ADAA): ProgramCam.de [employer] on Mental Illness (NAMI): www.nami.org Contact a health care provider if you: Have a hard time staying focused or finishing daily tasks. Spend many hours a day feeling worried about everyday life. Become exhausted by worry. Start to have headaches, feel tense, or have nausea. Urinate more than normal. Have diarrhea. Get help right away if you have: A racing heart and shortness of breath. Thoughts of hurting yourself or others. If you ever feel like you may hurt yourself or others, or have thoughts about taking your own life, get help right away. You can go to your nearest emergency department or call: Your local emergency services (911 in the [place] A suicide crisis helpline, such as the National Suicide Prevention Lifeline at 804-737-5988. This is open 24 hours a day. Summary Taking steps to learn and use tension reduction techniques can help calm you and help prevent triggering an anxiety reaction. When used together, medicines, psychotherapy, and tension reduction techniques may be the most effective treatment. Family, friends, and partners can play a big part in helping you recover from an anxiety disorder. This information is not intended to replace advice given to you by your health care provider. Make  sure you discuss any questions you have with your health care provider. Document Revised: 02/01/2019 Document Reviewed: 02/01/2019 Elsevier Patient Education  2022 Elsevier Inc.   LOCAL RESOURCES  [facility] [place], [place] Same Day Access Hours:  Monday, Wednesday and Friday, 8am - 3pm Walk-In Crisis Hours: 7 days/wk, 8am - 8pm [address], [place], [place] [address] [phone]  [employer] [phone]  Mobile Crisis Unit [phone]   Therapists/Counselors/Psychologists  [facility] [address] [facility] [address] [place], [place] [address] [phone]  Karen [place], [place]  & Jacqlyn Krauss [place]    [phone]        [address]       [place], [place] [address]        Ival Bible, CSW [phone] [address] [place], [place] [address]  Harle Battiest, [place]        [phone]        [address], [place] [address]        Chyrel Masson, [place] [phone] [address] [place], [place] [address]   Oscar La, LMFT       [phone]        [address]       [place], [place] [address]        Felecia Jan [phone] [address] [place], [place] [address]  [place]        [phone]        [address]       [place], [place] [address]        Kerin Salen [phone] [address] [place], [place] [address]  Tyron Russell, PsyD       [phone]        [address]       [place], [place] [address]        Elita Quick, LPC [phone] [address]  [place], [place] [address]   Debarah Crape        [phone]        [address] [place], [place] [address]         Rosana Hoes [facility] [phone] [email or web address]   Sation Konchella       [phone]        [address]       [place], [place] [address]        [facility] [phone] [email or web address]   ADD/ADHD Testing:   Lynetta Mare, PhD [address] [place], [place] [address] [phone] [facility] in [place], [place]   [place], [place] LPA [address] [place], [place] [address] Phone: [phone] Fax: [phone] [place] , [place], Medicaid   Legrand Rams [address] [place], [place] [address] [phone]    [place] [facility] in [place], [place]   ----------------------------------------------------------   Neuropsych Testing Dr Thora Lance [address] [place], [place]: [phone] F: [phone] [email or web address]    [facility]  Lubertha [place] Therapist: [phone]     Eating Disorders   [facility]  [email or web address] [phone]  [facility] and Life [facility] [phone], [phone]   Substance Abuse Treatment   [facility] [place], [place], [place], [place] [address] [facility] [place], [place], [place], [place] [employer], [place], [place] Recovery [place], [place], [place]   [employer] Care Program:  [email or web address]

## 2021-06-14 NOTE — Progress Notes (Signed)
````   Gynecology Annual Exam  PCP: Maple Hudson., MD  Chief Complaint:  Chief Complaint  Patient presents with   Gynecologic Exam    History of Present Illness: Patient is a 25 y.o. G0P0000 presents for annual exam. The patient has no complaints today.   LMP: Patient's last menstrual period was 05/21/2021. Average Interval: monthly Duration of flow: 3 days Heavy Menses: no Dysmenorrhea: no  She denies passage of large clots She denies sensations of gushing or flooding of blood. She denies accidents where she bleeds through her clothing. She denies that she changes a saturated pad or tampon more frequently than every hour.  She denies that pain from her periods limits her activities.  The patient does perform self breast exams.  There is no notable family history of breast or ovarian cancer in her family.  The patient reports she exercised 1-2 days a week.  The patient reports current symptoms of anxiety and depression.    PHQ-9: 8 GAD-7: 12   Review of Systems: Review of Systems  Constitutional:  Negative for chills, fever, malaise/fatigue and weight loss.  HENT:  Negative for congestion, hearing loss and sinus pain.   Eyes:  Negative for blurred vision and double vision.  Respiratory:  Negative for cough, sputum production, shortness of breath and wheezing.   Cardiovascular:  Negative for chest pain, palpitations, orthopnea and leg swelling.  Gastrointestinal:  Negative for abdominal pain, constipation, diarrhea, nausea and vomiting.  Genitourinary:  Negative for dysuria, flank pain, frequency, hematuria and urgency.  Musculoskeletal:  Negative for back pain, falls and joint pain.  Skin:  Negative for itching and rash.  Neurological:  Negative for dizziness and headaches.  Psychiatric/Behavioral:  Positive for depression. Negative for substance abuse and suicidal ideas. The patient is nervous/anxious.    Past Medical History:  Past Medical History:   Diagnosis Date   Anxiety    Depression     Past Surgical History:  Past Surgical History:  Procedure Laterality Date   KNEE SURGERY     TONSILLECTOMY AND ADENOIDECTOMY      Gynecologic History:  Patient's last menstrual period was 05/21/2021. Menarche: 13  History of fibroids, polyps, or ovarian cysts? : no  History of PCOS? no Hstory of Endometriosis? no History of abnormal pap smears? no Have you had any sexually transmitted infections in the past? no  She reports HPV vaccination in the past.   Last Pap: Results were: 2020 NIL  She identifies as a female. She is sexually active with men.   She denies dyspareunia. She denies postcoital bleeding.  She currently uses OCP (estrogen/progesterone) for contraception.    Obstetric History: G0P0000  Family History:  History reviewed. No pertinent family history.  Social History:  Social History   Socioeconomic History   Marital status: Single    Spouse name: Not on file   Number of children: Not on file   Years of education: Not on file   Highest education level: Not on file  Occupational History   Not on file  Tobacco Use   Smoking status: Never   Smokeless tobacco: Never  Vaping Use   Vaping Use: Never used  Substance and Sexual Activity   Alcohol use: Yes   Drug use: Never   Sexual activity: Not Currently    Birth control/protection: Pill  Other Topics Concern   Not on file  Social History Narrative   Not on file   Social Determinants of Health   Financial Resource  Strain: Not on file  Food Insecurity: Not on file  Transportation Needs: Not on file  Physical Activity: Not on file  Stress: Not on file  Social Connections: Not on file  Intimate Partner Violence: Not on file    Allergies:  No Known Allergies  Medications: Prior to Admission medications   Medication Sig Start Date End Date Taking? Authorizing Provider  NORTREL 1/35, 28, tablet TAKE 1 TABLET BY MOUTH DAILY 07/16/20  Yes Dorice Stiggers,  Crockett Rallo R, MD  cetirizine (ZYRTEC) 10 MG tablet Take 1 tablet (10 mg total) by mouth daily. Patient not taking: Reported on 06/14/2021 03/05/21   Maple Hudson., MD    Physical Exam Vitals: Blood pressure 116/72, height 5\' 5"  (1.651 m), weight 186 lb 6.4 oz (84.6 kg), last menstrual period 05/21/2021.  Physical Exam Constitutional:      Appearance: She is well-developed.  Genitourinary:     Genitourinary Comments: External: Normal appearing vulva. No lesions noted.  Speculum examination: Normal appearing cervix. No blood in the vaginal vault. No discharge.   Bimanual examination: Uterus midline, non-tender, normal in size, shape and contour.  No CMT. No adnexal masses. No adnexal tenderness. Pelvis not fixed.  Breast Exam: breast equal without skin changes, nipple discharge, breast lump or enlarged lymph nodes   HENT:     Head: Normocephalic and atraumatic.  Neck:     Thyroid: No thyromegaly.  Cardiovascular:     Rate and Rhythm: Normal rate and regular rhythm.     Heart sounds: Normal heart sounds.  Pulmonary:     Effort: Pulmonary effort is normal.     Breath sounds: Normal breath sounds.  Abdominal:     General: Bowel sounds are normal. There is no distension.     Palpations: Abdomen is soft. There is no mass.  Musculoskeletal:     Cervical back: Neck supple.  Neurological:     Mental Status: She is alert and oriented to person, place, and time.  Skin:    General: Skin is warm and dry.  Psychiatric:        Behavior: Behavior normal.        Thought Content: Thought content normal.        Judgment: Judgment normal.  Vitals reviewed.     Female chaperone present for pelvic and breast  portions of the physical exam  Assessment: 25 y.o. G0P0000 routine annual exam  Plan: Problem List Items Addressed This Visit   None Visit Diagnoses     Encounter for annual routine gynecological examination    -  Primary   Health maintenance examination       Cervical  cancer screening       Encounter for gynecological examination without abnormal finding       Encounter for screening breast examination       Encounter for birth control pills maintenance       Relevant Medications   norethindrone-ethinyl estradiol 1/35 (NORTREL 1/35, 28,) tablet   GAD (generalized anxiety disorder)       Relevant Medications   busPIRone (BUSPAR) 5 MG tablet   citalopram (CELEXA) 20 MG tablet   Other Relevant Orders   Ambulatory referral to Psychiatry   Screen for STD (sexually transmitted disease)       Relevant Orders   Cervicovaginal ancillary only       1) STI screening was offered and accepted  2) Pap smear is up to date. Due in September 2023.  3) Contraception - Would like  to continue OCP.  4) Routine healthcare maintenance including cholesterol, diabetes screening discussed managed by PCP  5) Discussed anxiety and depression-she reports that in the past she has been on a daily antidepressant which has worked well for her.  She would like to restart the medication.  She feels like she would benefit from a daily medicine with an additional anxiolytic as needed.  We will start Celexa BuSpar.  Patient has a therapist who she will consider contacting for follow-up.  She would also like to see a psychiatrist.  Referral for Dr. Maryruth Bun was placed.  Plan to follow up in 4 weeks  Adelene Idler MD, [facility], [facility] 06/14/2021 5:11 PM

## 2021-06-15 ENCOUNTER — Emergency Department
Admission: EM | Admit: 2021-06-15 | Discharge: 2021-06-15 | Disposition: A | Discharge status: Home / Self Care | Payer: BC Managed Care – PPO | Attending: Emergency Medicine | Admitting: Emergency Medicine

## 2021-06-15 ENCOUNTER — Other Ambulatory Visit: Payer: Self-pay

## 2021-06-15 ENCOUNTER — Emergency Department: Payer: BC Managed Care – PPO

## 2021-06-15 ENCOUNTER — Encounter: Payer: Self-pay | Admitting: Emergency Medicine

## 2021-06-15 DIAGNOSIS — Z79899 Other long term (current) drug therapy: Secondary | ICD-10-CM | POA: Insufficient documentation

## 2021-06-15 DIAGNOSIS — X501XXA Overexertion from prolonged static or awkward postures, initial encounter: Secondary | ICD-10-CM | POA: Diagnosis not present

## 2021-06-15 DIAGNOSIS — S93401A Sprain of unspecified ligament of right ankle, initial encounter: Secondary | ICD-10-CM | POA: Insufficient documentation

## 2021-06-15 DIAGNOSIS — S99911A Unspecified injury of right ankle, initial encounter: Secondary | ICD-10-CM | POA: Diagnosis not present

## 2021-06-15 DIAGNOSIS — M7989 Other specified soft tissue disorders: Secondary | ICD-10-CM | POA: Diagnosis not present

## 2021-06-15 MED ORDER — KETOROLAC TROMETHAMINE 30 MG/ML IJ SOLN
30.0000 mg | Freq: Once | INTRAMUSCULAR | Status: AC
  Administered 2021-06-15: 30 mg via INTRAMUSCULAR
  Filled 2021-06-15: qty 1

## 2021-06-15 MED ORDER — MELOXICAM 15 MG PO TABS: 15.0000 mg | ORAL_TABLET | Freq: Every day | ORAL | 2 refills | Status: AC

## 2021-06-15 NOTE — ED Provider Notes (Signed)
Emergency Medicine Provider Triage Evaluation Note  Maria Andersen, a 25 y.o. female  was evaluated in triage.  Pt complains of lateral ankle pain after mechanical injury.  Patient stepped out of a car, it was parked near drainage pipe, and rolled her ankle.  She denies any other injury at this time..  Review of Systems  Positive: Right ankle pain as above. Negative: CP, SOB  Physical Exam  Ht 5\' 5"  (1.651 m)   Wt 81.6 kg   LMP 05/21/2021   BMI 29.95 kg/m  Gen:   Awake, no distress  NAD Resp:  Normal effort CTA MSK:   Moves extremities without difficulty Lateral STS right ankle Other:  CVS: RRR. Normal distal pulses  Medical Decision Making  Medically screening exam initiated at 7:11 PM.  Appropriate orders placed.  Maria Andersen was informed that the remainder of the evaluation will be completed by another provider, this initial triage assessment does not replace that evaluation, and the importance of remaining in the ED until their evaluation is complete.  Patient with ED evaluation of right ankle injury   Dorris Carnes, PA-C 06/15/21 1913    08/15/21, MD 06/15/21 1944

## 2021-06-15 NOTE — ED Provider Notes (Signed)
[facility]-EMERGENCY DEPARTMENT  ____________________________________________  Time seen: Approximately 9:12 PM  I have reviewed the triage vital signs and the nursing notes.   HISTORY  Chief Complaint Ankle Pain   Historian Patient     HPI Maria Andersen is a 25 y.o. female presents to the emergency department with right lateral ankle pain after an inversion type ankle injury while patient was trying to get into a friend's car.  Patient denies prior ankle sprains in the past.  She has had difficulty bearing weight since injury occurred.  No abrasions or lacerations.   Past Medical History:  Diagnosis Date   Anxiety    Depression      Immunizations up to date:  Yes.     Past Medical History:  Diagnosis Date   Anxiety    Depression     Patient Active Problem List   Diagnosis Date Noted   Left breast lump 02/26/2021    Past Surgical History:  Procedure Laterality Date   KNEE SURGERY     TONSILLECTOMY AND ADENOIDECTOMY      Prior to Admission medications   Medication Sig Start Date End Date Taking? Authorizing Provider  meloxicam (MOBIC) 15 MG tablet Take 1 tablet (15 mg total) by mouth daily. 06/15/21 06/15/22 Yes Pia Mau M, PA-C  busPIRone (BUSPAR) 5 MG tablet Take 1 tablet (5 mg total) by mouth 3 (three) times daily. 06/14/21   Schuman, Jaquelyn Bitter, MD  cetirizine (ZYRTEC) 10 MG tablet Take 1 tablet (10 mg total) by mouth daily. Patient not taking: Reported on 06/14/2021 03/05/21   Maple Hudson., MD  citalopram (CELEXA) 20 MG tablet Take 1 tablet (20 mg total) by mouth daily. 06/14/21   Natale Milch, MD  norethindrone-ethinyl estradiol 1/35 (NORTREL 1/35, 28,) tablet Take 1 tablet by mouth daily. 06/14/21   Natale Milch, MD    Allergies Patient has no known allergies.  No family history on file.  Social History Social History   Tobacco Use   Smoking status: Never   Smokeless tobacco: Never  Vaping Use   Vaping Use: Never  used  Substance Use Topics   Alcohol use: Yes    Comment: Occassional   Drug use: Never     Review of Systems  Constitutional: No fever/chills Eyes:  No discharge ENT: No upper respiratory complaints. Respiratory: no cough. No SOB/ use of accessory muscles to breath Gastrointestinal:   No nausea, no vomiting.  No diarrhea.  No constipation. Musculoskeletal: Patient has right ankle pain.  Skin: Negative for rash, abrasions, lacerations, ecchymosis.   ____________________________________________   PHYSICAL EXAM:  VITAL SIGNS: ED Triage Vitals  Enc Vitals Group     BP 06/15/21 1915 (!) 154/95     Pulse Rate 06/15/21 1915 96     Resp 06/15/21 1915 17     Temp 06/15/21 1915 98.3 F (36.8 C)     Temp Source 06/15/21 1915 Oral     SpO2 06/15/21 1915 97 %     Weight 06/15/21 1909 180 lb (81.6 kg)     Height 06/15/21 1909 5\' 5"  (1.651 m)     Head Circumference --      Peak Flow --      Pain Score 06/15/21 1909 9     Pain Loc --      Pain Edu? --      Excl. in GC? --      Constitutional: Alert and oriented. Well appearing and in no acute distress. Eyes: Conjunctivae  are normal. PERRL. EOMI. Head: Atraumatic. Cardiovascular: Normal rate, regular rhythm. Normal S1 and S2.  Good peripheral circulation. Respiratory: Normal respiratory effort without tachypnea or retractions. Lungs CTAB. Good air entry to the bases with no decreased or absent breath sounds Gastrointestinal: Bowel sounds x 4 quadrants. Soft and nontender to palpation. No guarding or rigidity. No distention. Musculoskeletal: Patient has pain and tenderness to palpation over anterior and posterior talofibular ligaments on the right as well as the deltoid ligament.  Patient is able to move all 5 right toes.  Palpable dorsalis pedis pulse, right. Neurologic:  Normal for age. No gross focal neurologic deficits are appreciated.  Skin:  Skin is warm, dry and intact. No rash noted. Psychiatric: Mood and affect are normal  for age. Speech and behavior are normal.   ____________________________________________   LABS (all labs ordered are listed, but only abnormal results are displayed)  Labs Reviewed - No data to display ____________________________________________  EKG   ____________________________________________  RADIOLOGY Geraldo Pitter, personally viewed and evaluated these images (plain radiographs) as part of my medical decision making, as well as reviewing the written report by the radiologist.  DG Ankle Complete Right  Result Date: 06/15/2021 CLINICAL DATA:  Pain after rolling the ankle while getting out of a vehicle. EXAM: RIGHT ANKLE - COMPLETE 3+ VIEW COMPARISON:  None. FINDINGS: There is no evidence of fracture, dislocation, or joint effusion. There is no evidence of arthropathy or other focal bone abnormality. There is soft tissue swelling of the lateral ankle. IMPRESSION: No acute osseous injury. Electronically Signed   By: Romona Curls M.D.   On: 06/15/2021 19:37    ____________________________________________    PROCEDURES  Procedure(s) performed:     Procedures     Medications - No data to display   ____________________________________________   INITIAL IMPRESSION / ASSESSMENT AND PLAN / ED COURSE  Pertinent labs & imaging results that were available during my care of the patient were reviewed by me and considered in my medical decision making (see chart for details).  Clinical Course as of 06/15/21 2112  Sat Jun 15, 2021  2102 DG Ankle Complete Right [JW]    Clinical Course User Index [JW] Pia Mau M, PA-C   Assessment and plan Right lateral ankle pain 25 year old female presents to the emergency department with right lateral ankle pain after an inversion type ankle injury.  Patient was hypertensive at triage  Patient was placed in a cam boot in the emergency department.  She already had crutches from home.  She was given an injection of Toradol  prior to discharge and advised to use meloxicam at home.  Return precautions were given to return with new or worsening symptoms.      ____________________________________________  FINAL CLINICAL IMPRESSION(S) / ED DIAGNOSES  Final diagnoses:  Sprain of right ankle, unspecified ligament, initial encounter      NEW MEDICATIONS STARTED DURING THIS VISIT:  ED Discharge Orders          Ordered    meloxicam (MOBIC) 15 MG tablet  Daily        06/15/21 2107                This chart was dictated using voice recognition software/Dragon. Despite best efforts to proofread, errors can occur which can change the meaning. Any change was purely unintentional.     Gasper Lloyd 06/15/21 2115    Phineas Semen, MD 06/15/21 2127

## 2021-06-15 NOTE — Discharge Instructions (Signed)
Keep right ankle elevated at home. Please rest, ice and keep ankle elevated at home.  If symptoms or not improving progressively over the next couple of weeks.,  Please follow-up with podiatry.

## 2021-06-15 NOTE — ED Triage Notes (Signed)
Pt in via POV, reports rolling her right ankle as she was getting out of a vehicle.  Tearful in triage; states she has not tried to be up on it since.  Significant swelling noted.  Jenise, PA in to perform MSE.

## 2021-06-18 LAB — CERVICOVAGINAL ANCILLARY ONLY
Chlamydia: NEGATIVE
Comment: NEGATIVE
Comment: NEGATIVE
Comment: NORMAL
Neisseria Gonorrhea: NEGATIVE
Trichomonas: NEGATIVE

## 2021-06-21 ENCOUNTER — Telehealth: Payer: Self-pay

## 2021-06-21 NOTE — Telephone Encounter (Signed)
Pt calling; her MyChart is not working; would lilke results from [employer] last week.  [phone]  Left detailed msg that her results are negative and that's what we want.  Number given to help c her MyChart.

## 2021-06-27 ENCOUNTER — Other Ambulatory Visit: Payer: Self-pay | Admitting: Obstetrics and Gynecology

## 2021-06-27 DIAGNOSIS — F331 Major depressive disorder, recurrent, moderate: Secondary | ICD-10-CM | POA: Diagnosis not present

## 2021-06-27 DIAGNOSIS — F411 Generalized anxiety disorder: Secondary | ICD-10-CM | POA: Diagnosis not present

## 2021-06-27 DIAGNOSIS — Z3041 Encounter for surveillance of contraceptive pills: Secondary | ICD-10-CM

## 2021-06-27 DIAGNOSIS — Z1389 Encounter for screening for other disorder: Secondary | ICD-10-CM | POA: Diagnosis not present

## 2021-06-27 DIAGNOSIS — Z79899 Other long term (current) drug therapy: Secondary | ICD-10-CM | POA: Diagnosis not present

## 2021-07-11 ENCOUNTER — Ambulatory Visit: Payer: BC Managed Care – PPO | Admitting: Obstetrics and Gynecology

## 2021-09-17 ENCOUNTER — Encounter: Payer: Self-pay | Admitting: Family Medicine

## 2021-10-18 ENCOUNTER — Other Ambulatory Visit: Payer: Self-pay | Admitting: Obstetrics and Gynecology

## 2021-10-18 DIAGNOSIS — F411 Generalized anxiety disorder: Secondary | ICD-10-CM

## 2021-12-04 IMAGING — CR DG ANKLE COMPLETE 3+V*R*
3 series · 3 of 3 positions shown · non-contrast
Comparison: None.

CLINICAL DATA: Pain after rolling the ankle while getting out of a
vehicle.

EXAM:
RIGHT ANKLE - COMPLETE 3+ VIEW

[ankle ap]
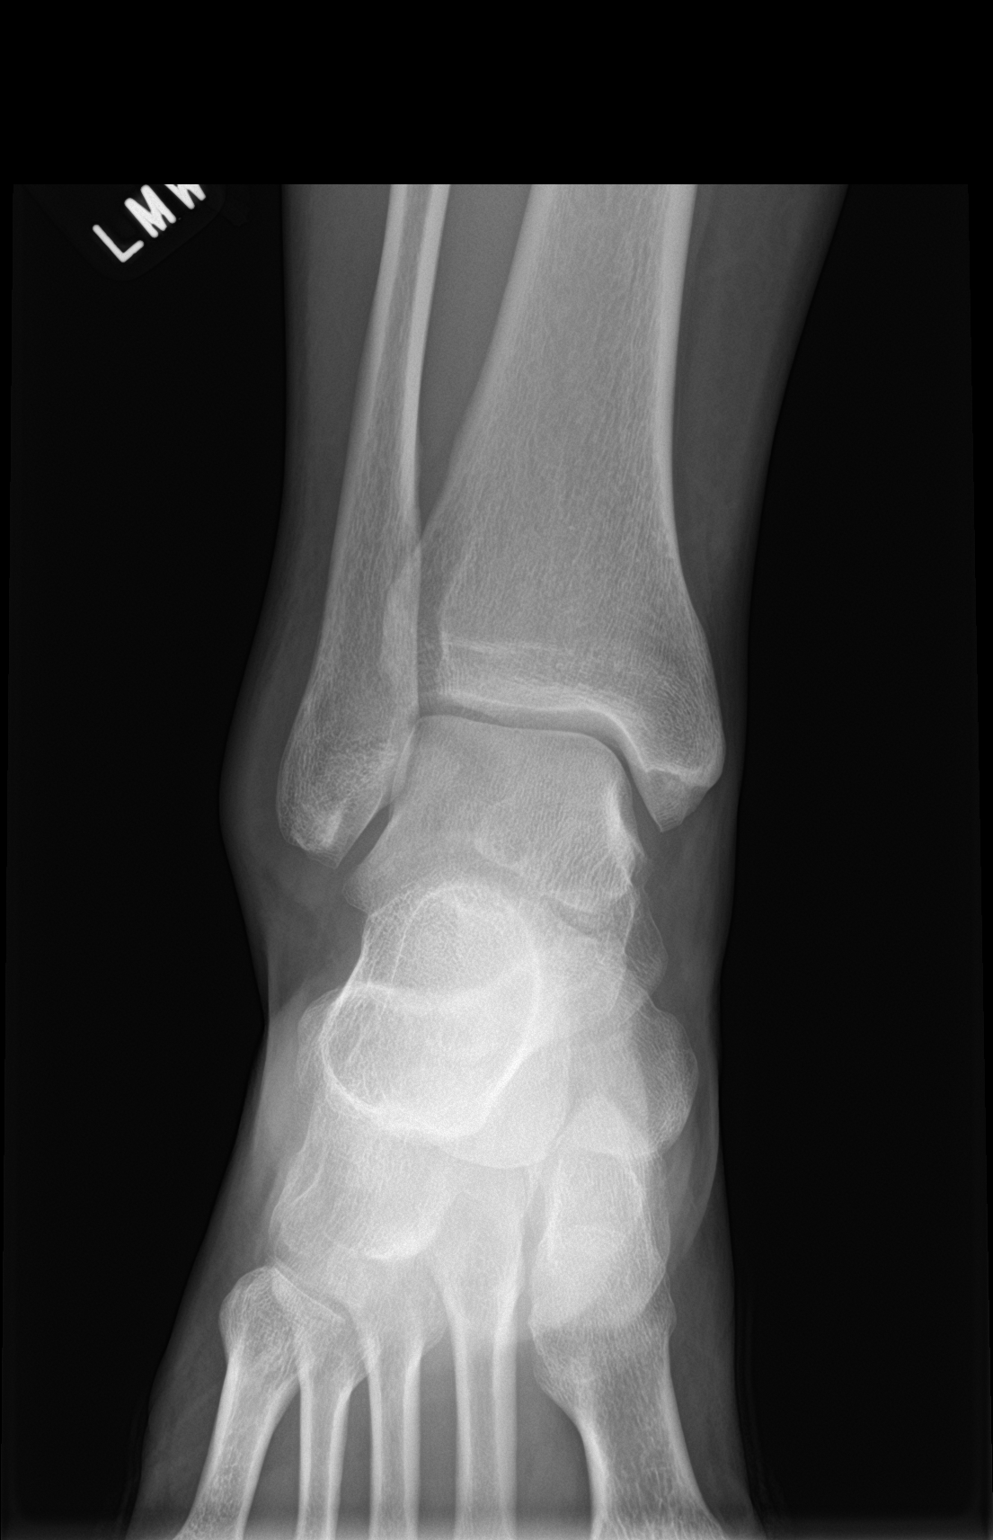

[ankle obl]
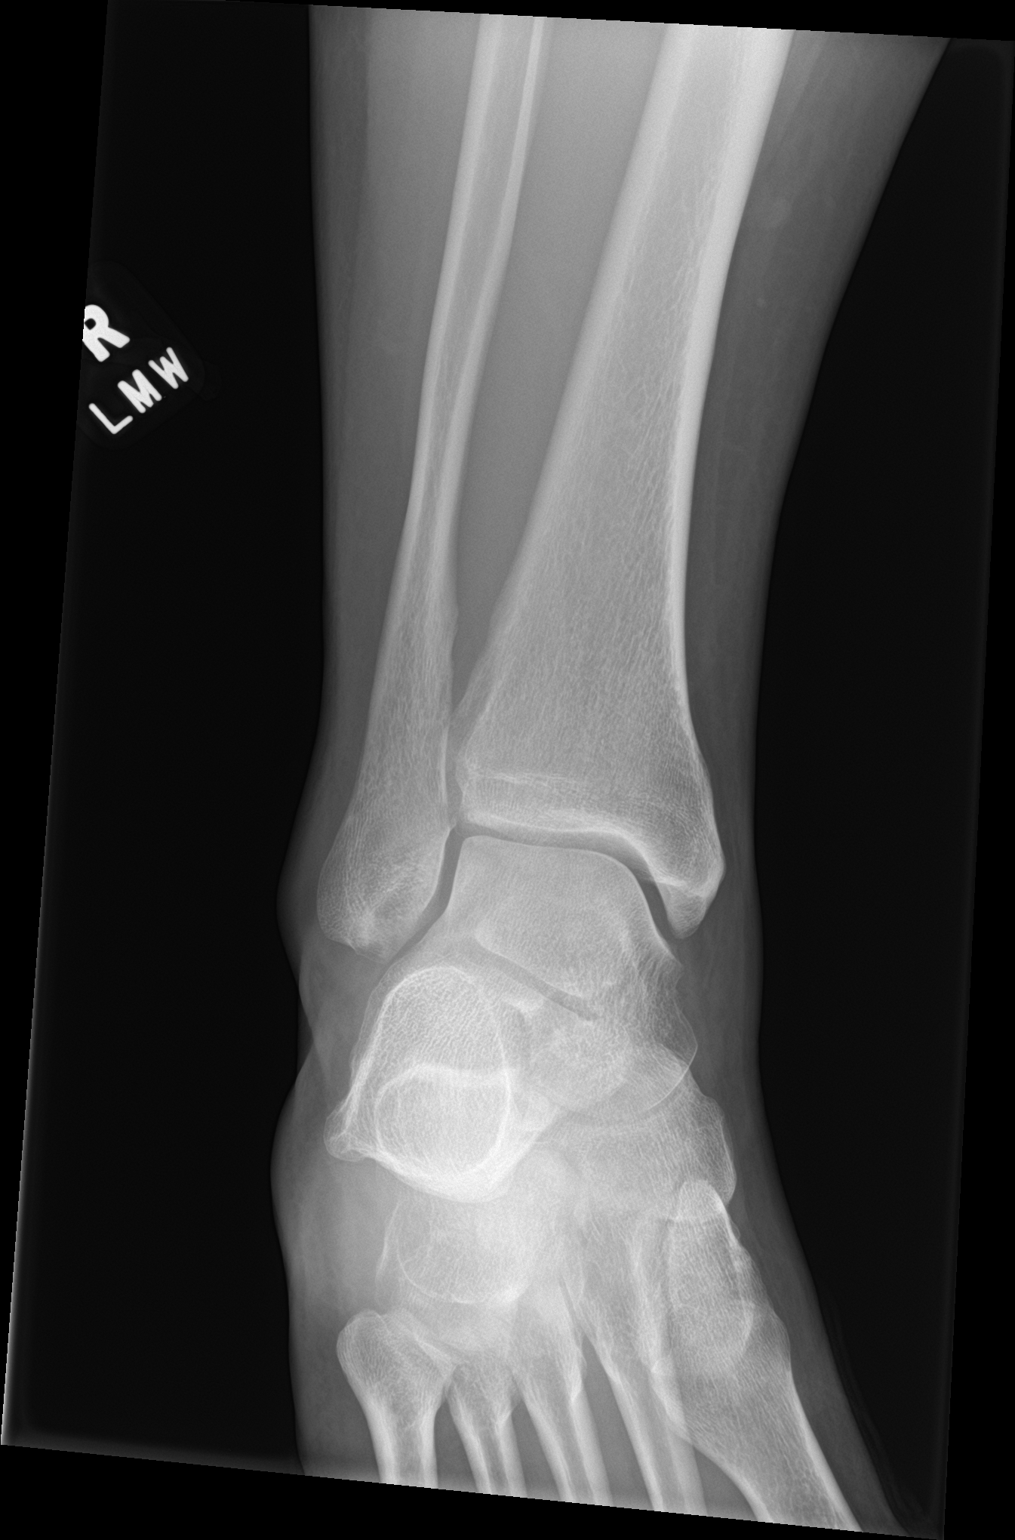

[ankle lat]
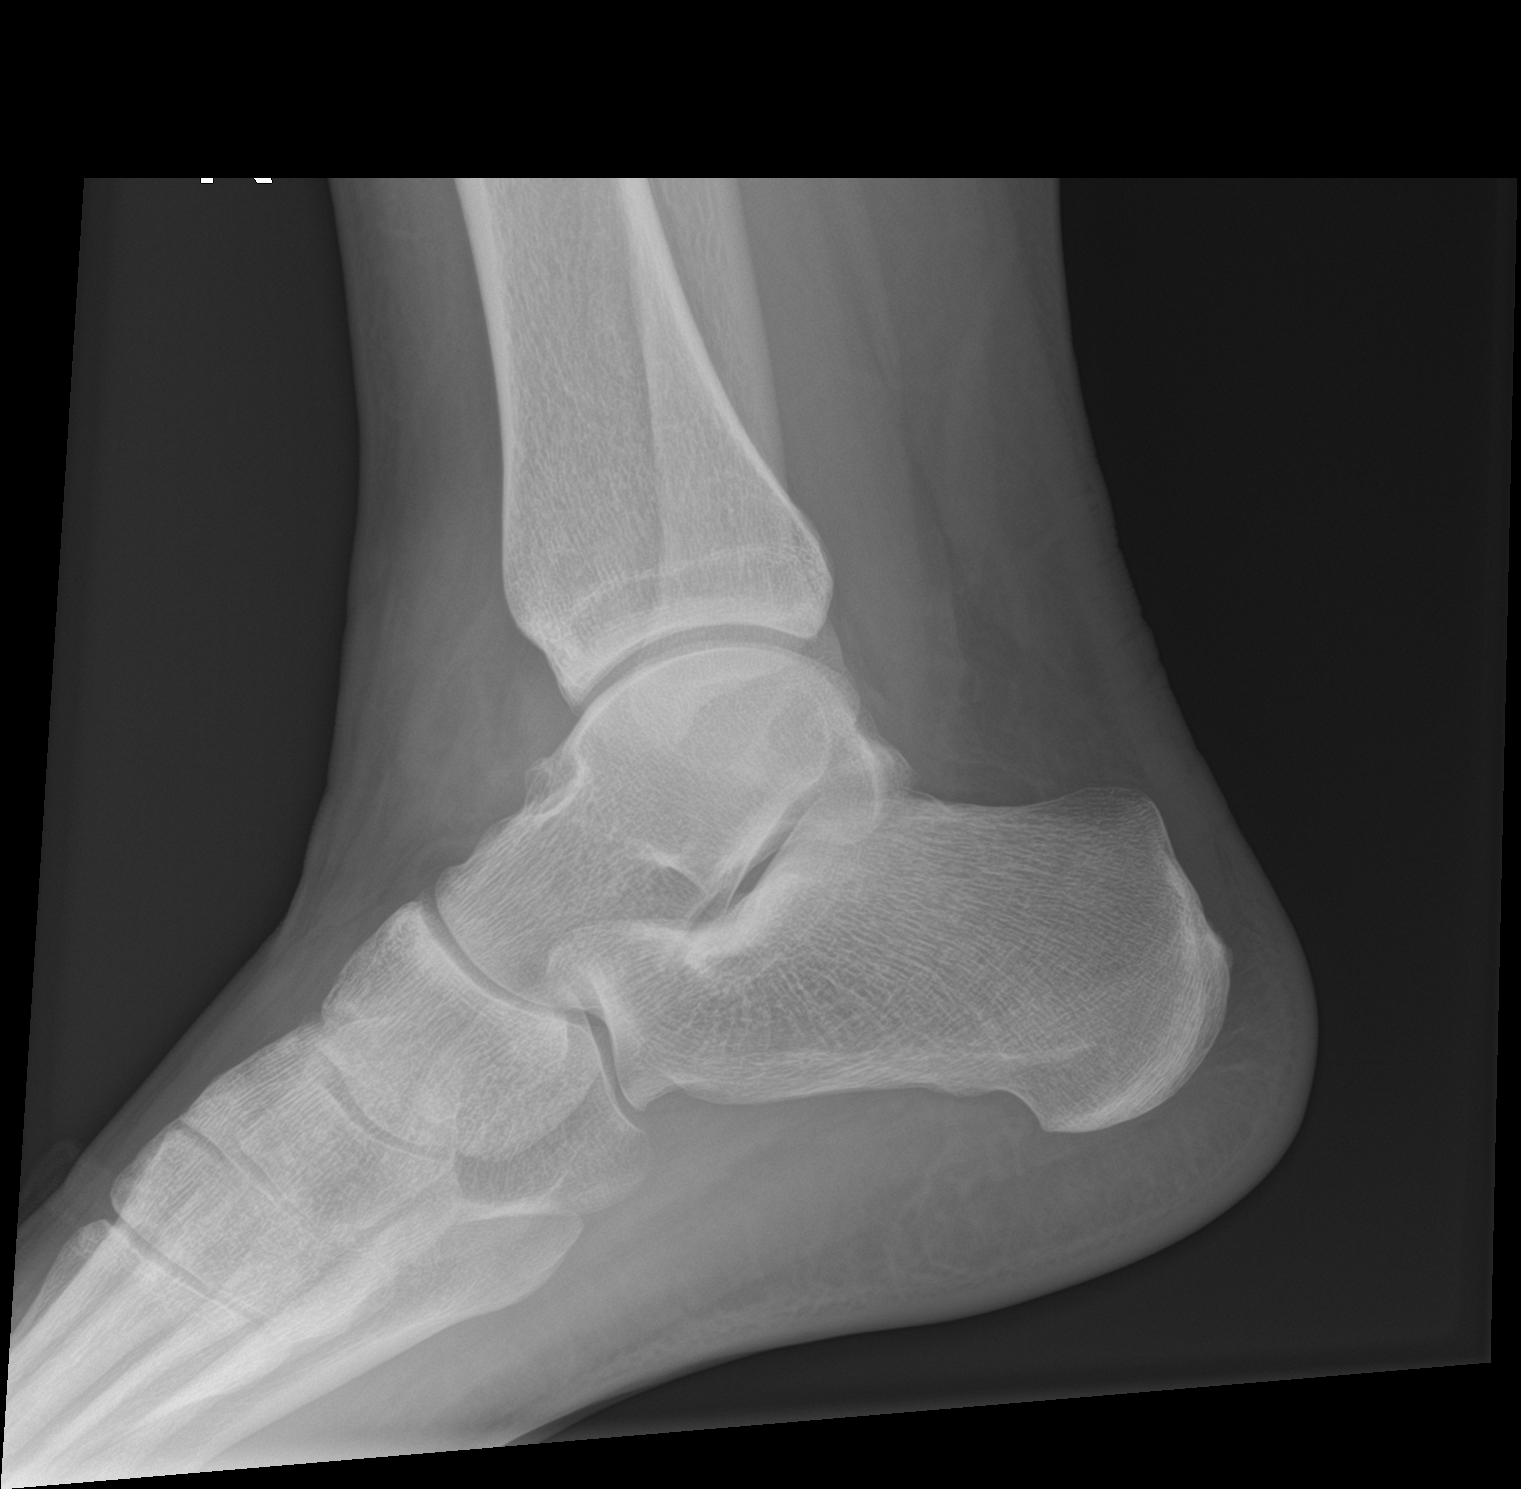

[3 of 3 positions shown; findings below may reference images not displayed]

FINDINGS: There is no evidence of fracture, dislocation, or joint effusion.
There is no evidence of arthropathy or other focal bone abnormality.
There is soft tissue swelling of the lateral ankle.
IMPRESSION: No acute osseous injury.

## 2022-02-17 ENCOUNTER — Telehealth: Payer: Self-pay

## 2022-02-17 NOTE — Telephone Encounter (Signed)
Pt called triage wanting labs done TSH B12 Vitamin D, I advised pt to call her PCP. Pt seen Dr Jerene Pitch and She's no longer at our practice

## 2022-02-17 NOTE — Telephone Encounter (Signed)
Copied from CRM [id number]. Topic: Appointment Scheduling - Scheduling Inquiry for Clinic >> Feb 17, 2022  1:06 PM Randol Kern wrote: Reason for CRM: Pt wants to have her Thyroid, B12, and "all levels" checked at [facility], wants PCP to let her know once orders are ready.   Best contact: [phone]

## 2022-02-18 ENCOUNTER — Ambulatory Visit: Payer: BC Managed Care – PPO | Admitting: Family Medicine

## 2022-02-18 NOTE — Telephone Encounter (Signed)
Secured appt for today with Dr. Rosanna Randy. States increased fatigue x 2 months, hair loss x 1 month.

## 2022-02-25 NOTE — Progress Notes (Signed)
Established patient visit  I,April Miller,acting as a scribe for Wilhemena Durie, MD.,have documented all relevant documentation on the behalf of Wilhemena Durie, MD,as directed by  Wilhemena Durie, MD while in the presence of Wilhemena Durie, MD.   Patient: Maria Andersen   DOB: April 27, 1996   26 y.o. Female  MRN: [id number] Visit Date: 02/26/2022  Today's healthcare provider: Wilhemena Durie, MD   Chief Complaint  Patient presents with   Fatigue   Alopecia   Subjective    HPI  Fatigue  She reports new onset fatigue which she describes as feeling sleepy. It began about a month ago and occurs a few days a week. It is described as moderate and staying constant. She has not started new medications around the time the fatigue started.  Her main complaint is one of her hair followed out.  No patches of hair falling out.  Just generally. Associated symptoms: Yes arthralgias No bleeding  No melena No chest discomfort  No heart palpitations No heart racing   No dyspnea Yes feeling depressed  Yes feeling anxious or under stress No fevers  No loss of appetite Yes nausea  No vomiting Yes sleeping problems Yes Hair Loss    Wt Readings from Last 3 Encounters:  02/26/22 188 lb (85.3 kg)  06/15/21 180 lb (81.6 kg)  06/14/21 186 lb 6.4 oz (84.6 kg)    No results found for: "WBC", "HGB", "HCT", "MCV", "PLT"No results found for: "TSH" No results found for: "NA", "K", "CO2", "BUN", "CREATININE", "CALCIUM", "GLUCOSE"   ---------------------------------------------------------------------------------------------------   Medications: Outpatient Medications Prior to Visit  Medication Sig   busPIRone (BUSPAR) 5 MG tablet Take 1 tablet (5 mg total) by mouth 3 (three) times daily.   citalopram (CELEXA) 20 MG tablet TAKE 1 TABLET(20 MG) BY MOUTH DAILY   meloxicam (MOBIC) 15 MG tablet Take 1 tablet (15 mg total) by mouth daily.   NORTREL 1/35, 28, tablet TAKE 1 TABLET BY  MOUTH DAILY   [DISCONTINUED] cetirizine (ZYRTEC) 10 MG tablet Take 1 tablet (10 mg total) by mouth daily. (Patient not taking: Reported on 02/26/2022)   No facility-administered medications prior to visit.    Review of Systems  Last thyroid functions Lab Results  Component Value Date   TSH 1.520 02/26/2022       Objective    BP 130/79 (BP Location: Right Arm, Patient Position: Sitting, Cuff Size: Large)   Pulse 73   Resp 14   Wt 188 lb (85.3 kg)   LMP 02/10/2022   SpO2 97%   BMI 31.28 kg/m  BP Readings from Last 3 Encounters:  02/26/22 130/79  06/15/21 136/74  06/14/21 116/72   Wt Readings from Last 3 Encounters:  02/26/22 188 lb (85.3 kg)  06/15/21 180 lb (81.6 kg)  06/14/21 186 lb 6.4 oz (84.6 kg)      Physical Exam Vitals reviewed.  HENT:     Head: Normocephalic and atraumatic.     Right Ear: External ear normal.     Left Ear: External ear normal.     Nose: Nose normal.     Mouth/Throat:     Pharynx: Oropharynx is clear.  Eyes:     General: No scleral icterus.    Conjunctiva/sclera: Conjunctivae normal.  Cardiovascular:     Rate and Rhythm: Normal rate and regular rhythm.     Pulses: Normal pulses.     Heart sounds: Normal heart sounds.  Pulmonary:  Effort: Pulmonary effort is normal.     Breath sounds: Normal breath sounds.  Abdominal:     Palpations: Abdomen is soft.  Skin:    General: Skin is warm and dry.     Capillary Refill: Capillary refill takes less than 2 seconds.     Findings: No rash.  Neurological:     General: No focal deficit present.     Mental Status: She is alert and oriented to person, place, and time.  Psychiatric:        Mood and Affect: Mood normal.        Behavior: Behavior normal.        Thought Content: Thought content normal.        Judgment: Judgment normal.       No results found for any visits on 02/26/22.  Assessment & Plan     1. Other fatigue I think this is multifactorial.  - CBC w/Diff/Platelet -  TSH - Comprehensive Metabolic Panel (CMET)  2. Alopecia If TSH is normal consider referring to dermatology. - CBC w/Diff/Platelet - TSH - Comprehensive Metabolic Panel (CMET)   No follow-ups on file.      I, Wilhemena Durie, MD, have reviewed all documentation for this visit. The documentation on 03/04/22 for the exam, diagnosis, procedures, and orders are all accurate and complete.    Alvin Rubano Cranford Mon, MD  [facility] [phone] (phone) [phone] (fax)  [place]

## 2022-02-26 ENCOUNTER — Ambulatory Visit: Payer: BC Managed Care – PPO | Admitting: Family Medicine

## 2022-02-26 ENCOUNTER — Encounter: Payer: Self-pay | Admitting: Family Medicine

## 2022-02-26 VITALS — BP 130/79 | HR 73 | Resp 14 | Wt 188.0 lb

## 2022-02-26 DIAGNOSIS — R5383 Other fatigue: Secondary | ICD-10-CM | POA: Diagnosis not present

## 2022-02-26 DIAGNOSIS — L659 Nonscarring hair loss, unspecified: Secondary | ICD-10-CM | POA: Diagnosis not present

## 2022-02-27 LAB — COMPREHENSIVE METABOLIC PANEL
ALT: 13 IU/L (ref 0–32)
AST: 11 IU/L (ref 0–40)
Albumin/Globulin Ratio: 2.4 — ABNORMAL HIGH (ref 1.2–2.2)
Albumin: 4.5 g/dL (ref 3.9–5.0)
Alkaline Phosphatase: 75 IU/L (ref 44–121)
BUN/Creatinine Ratio: 10 (ref 9–23)
BUN: 10 mg/dL (ref 6–20)
Bilirubin Total: 0.3 mg/dL (ref 0.0–1.2)
CO2: 20 mmol/L (ref 20–29)
Calcium: 9.3 mg/dL (ref 8.7–10.2)
Chloride: 107 mmol/L — ABNORMAL HIGH (ref 96–106)
Creatinine, Ser: 1.01 mg/dL — ABNORMAL HIGH (ref 0.57–1.00)
Globulin, Total: 1.9 g/dL (ref 1.5–4.5)
Glucose: 82 mg/dL (ref 70–99)
Potassium: 4.3 mmol/L (ref 3.5–5.2)
Sodium: 142 mmol/L (ref 134–144)
Total Protein: 6.4 g/dL (ref 6.0–8.5)
eGFR: 80 mL/min/{1.73_m2} (ref >59)

## 2022-02-27 LAB — CBC WITH DIFFERENTIAL/PLATELET
Basophils Absolute: 0 10*3/uL (ref 0.0–0.2)
Basos: 0 %
EOS (ABSOLUTE): 0 10*3/uL (ref 0.0–0.4)
Eos: 0 %
Hematocrit: 39.6 % (ref 34.0–46.6)
Hemoglobin: 13.2 g/dL (ref 11.1–15.9)
Immature Grans (Abs): 0 10*3/uL (ref 0.0–0.1)
Immature Granulocytes: 0 %
Lymphocytes Absolute: 1.8 10*3/uL (ref 0.7–3.1)
Lymphs: 27 %
MCH: 29.2 pg (ref 26.6–33.0)
MCHC: 33.3 g/dL (ref 31.5–35.7)
MCV: 88 fL (ref 79–97)
Monocytes Absolute: 0.6 10*3/uL (ref 0.1–0.9)
Monocytes: 9 %
Neutrophils Absolute: 4.3 10*3/uL (ref 1.4–7.0)
Neutrophils: 64 %
Platelets: 295 10*3/uL (ref 150–450)
RBC: 4.52 x10E6/uL (ref 3.77–5.28)
RDW: 12.9 % (ref 11.7–15.4)
WBC: 6.7 10*3/uL (ref 3.4–10.8)

## 2022-02-27 LAB — TSH: TSH: 1.52 u[IU]/mL (ref 0.450–4.500)

## 2022-03-03 ENCOUNTER — Telehealth: Payer: Self-pay

## 2022-03-03 NOTE — Telephone Encounter (Signed)
Copied from CRM [id number]. Topic: General - Inquiry >> Mar 03, 2022  3:58 PM De Blanch wrote: Reason for CRM: Pt requesting a call back to discuss recent labs.

## 2022-03-10 ENCOUNTER — Telehealth: Payer: Self-pay

## 2022-03-10 NOTE — Telephone Encounter (Signed)
Refill request recv'd from pharm for citalopram 20mg  tabs one po daily. Pt's annual is not due until 05/2023.  Pharm # [phone]

## 2022-03-17 ENCOUNTER — Other Ambulatory Visit: Payer: Self-pay | Admitting: Obstetrics

## 2022-03-17 DIAGNOSIS — F411 Generalized anxiety disorder: Secondary | ICD-10-CM

## 2022-03-17 MED ORDER — CITALOPRAM HYDROBROMIDE 20 MG PO TABS: 20.0000 mg | ORAL_TABLET | Freq: Every day | ORAL | 3 refills | Status: DC

## 2022-03-17 NOTE — Progress Notes (Signed)
Patient has contacted the office requesting renewal of Celexa. According to her chart, she discussed sxs of anxiety and depression last year with Dr. Bjorn Pippin, who instructed her to contact her counselor and also referred her to psychiatry. The patient's RX was only written for three months, so it appears the medication has not been take consistently. I have advised the office MA to instruct this patient to f/u as Bjorn Pippin had advised her- she may opt for her PCP to handle her medication or psychiatry.  She is on two different meds, and I do not generally handle polypharmacy psychiatry medication.  Mirna Mires, CNM  03/17/2022 4:21 PM

## 2022-03-19 NOTE — Telephone Encounter (Signed)
Pt aware of MMF's recommendations; wants to schedule annual exam; tx'd to [place] for scheduling.

## 2022-06-16 ENCOUNTER — Telehealth: Payer: Self-pay

## 2022-06-16 NOTE — Telephone Encounter (Signed)
Pt called triage to get the name of the Psychiatry Dr that she was referred to, pt aware it was Dr Gavin Pound.

## 2022-06-16 NOTE — Progress Notes (Signed)
PCP:  Jerrol Banana., MD   Chief Complaint  Patient presents with   Gynecologic Exam    Monthly cycles 2 days light/moderate flow no pain, no concerns     HPI:      Ms. Maria Andersen is a 26 y.o. G0P0000 whose LMP was Patient's last menstrual period was 05/28/2022 (approximate)., presents today for her annual examination.  Her menses are regular every 28-30 days, lasting 2 days.  Dysmenorrhea minimal. She does not have intermenstrual bleeding.  Sex activity: single partner, contraception - OCP (estrogen/progesterone).  Last Pap: 05/19/19 Results were: no abnormalities  Hx of STDs: none  There is no FH of breast cancer. There is no FH of ovarian cancer. The patient does not do self-breast exams.  Tobacco use: The patient denies current or previous tobacco use. Alcohol use: social drinker No drug use.  Exercise: moderately active  She does get adequate calcium but not Vitamin D in her diet. Hx of anxiety, was on citalopram and buspar. Stopped meds, waiting for psych appt with Dr. Nicolasa Ducking at end of the month.   Past Medical History:  Diagnosis Date   Anxiety    Depression     Past Surgical History:  Procedure Laterality Date   KNEE SURGERY     TONSILLECTOMY AND ADENOIDECTOMY      History reviewed. No pertinent family history.  Social History   Socioeconomic History   Marital status: Single    Spouse name: Not on file   Number of children: Not on file   Years of education: Not on file   Highest education level: Not on file  Occupational History   Not on file  Tobacco Use   Smoking status: Never   Smokeless tobacco: Never  Vaping Use   Vaping Use: Never used  Substance and Sexual Activity   Alcohol use: Yes    Comment: Occassional   Drug use: Never   Sexual activity: Yes    Birth control/protection: Pill  Other Topics Concern   Not on file  Social History Narrative   Not on file   Social Determinants of Health   Financial Resource Strain: Not on  file  Food Insecurity: Not on file  Transportation Needs: Not on file  Physical Activity: Not on file  Stress: Not on file  Social Connections: Not on file  Intimate Partner Violence: Not on file     Current Outpatient Medications:    busPIRone (BUSPAR) 5 MG tablet, Take 1 tablet (5 mg total) by mouth 3 (three) times daily. (Patient not taking: Reported on 06/17/2022), Disp: 90 tablet, Rfl: 3   citalopram (CELEXA) 20 MG tablet, TAKE 1 TABLET(20 MG) BY MOUTH DAILY (Patient not taking: Reported on 06/17/2022), Disp: 30 tablet, Rfl: 3   citalopram (CELEXA) 20 MG tablet, Take 1 tablet (20 mg total) by mouth daily. (Patient not taking: Reported on 06/17/2022), Disp: 30 tablet, Rfl: 3   norethindrone-ethinyl estradiol 1/35 (NORTREL 1/35, 28,) tablet, Take 1 tablet by mouth daily., Disp: 84 tablet, Rfl: 3     ROS:  Review of Systems  Constitutional:  Negative for fatigue, fever and unexpected weight change.  Respiratory:  Negative for cough, shortness of breath and wheezing.   Cardiovascular:  Negative for chest pain, palpitations and leg swelling.  Gastrointestinal:  Negative for blood in stool, constipation, diarrhea, nausea and vomiting.  Endocrine: Negative for cold intolerance, heat intolerance and polyuria.  Genitourinary:  Negative for dyspareunia, dysuria, flank pain, frequency, genital sores, hematuria, menstrual  problem, pelvic pain, urgency, vaginal bleeding, vaginal discharge and vaginal pain.  Musculoskeletal:  Negative for back pain, joint swelling and myalgias.  Skin:  Negative for rash.  Neurological:  Negative for dizziness, syncope, light-headedness, numbness and headaches.  Hematological:  Negative for adenopathy.  Psychiatric/Behavioral:  Positive for agitation. Negative for confusion, sleep disturbance and suicidal ideas. The patient is not nervous/anxious.    BREAST: No symptoms   Objective: BP 100/70   Ht 5\' 5"  (1.651 m)   Wt 200 lb (90.7 kg)   LMP 05/28/2022  (Approximate)   BMI 33.28 kg/m    Physical Exam Constitutional:      Appearance: She is well-developed.  Genitourinary:     Vulva normal.     Right Labia: No rash, tenderness or lesions.    Left Labia: No tenderness, lesions or rash.    No vaginal discharge, erythema or tenderness.      Right Adnexa: not tender and no mass present.    Left Adnexa: not tender and no mass present.    No cervical friability or polyp.     Uterus is not enlarged or tender.  Breasts:    Right: No mass, nipple discharge, skin change or tenderness.     Left: No mass, nipple discharge, skin change or tenderness.  Neck:     Thyroid: No thyromegaly.  Cardiovascular:     Rate and Rhythm: Normal rate and regular rhythm.     Heart sounds: Normal heart sounds. No murmur heard. Pulmonary:     Effort: Pulmonary effort is normal.     Breath sounds: Normal breath sounds.  Abdominal:     Palpations: Abdomen is soft.     Tenderness: There is no abdominal tenderness. There is no guarding or rebound.  Musculoskeletal:        General: Normal range of motion.     Cervical back: Normal range of motion.  Lymphadenopathy:     Cervical: No cervical adenopathy.  Neurological:     General: No focal deficit present.     Mental Status: She is alert and oriented to person, place, and time.     Cranial Nerves: No cranial nerve deficit.  Skin:    General: Skin is warm and dry.  Psychiatric:        Mood and Affect: Mood normal.        Behavior: Behavior normal.        Thought Content: Thought content normal.        Judgment: Judgment normal.  Vitals reviewed.     Assessment/Plan: Encounter for annual routine gynecological examination  Cervical cancer screening - Plan: Cytology - PAP  Screening for STD (sexually transmitted disease) - Plan: Cytology - PAP  Encounter for surveillance of contraceptive pills - Plan: norethindrone-ethinyl estradiol 1/35 (NORTREL 1/35, 28,) tablet; OCP RF  GAD (generalized anxiety  disorder)--seeing Dr. Nicolasa Ducking at end of month. Increase exercise.   Meds ordered this encounter  Medications   norethindrone-ethinyl estradiol 1/35 (NORTREL 1/35, 28,) tablet    Sig: Take 1 tablet by mouth daily.    Dispense:  84 tablet    Refill:  3    Order Specific Question:   Supervising Provider    Answer:   Renaldo Reel             GYN counsel adequate intake of calcium and vitamin D, diet and exercise     F/U  Return in about 1 year (around 06/18/2023).  Maria Andersen B. Tracey Hermance, PA-C 06/17/2022 8:40  AM

## 2022-06-17 ENCOUNTER — Encounter: Payer: Self-pay | Admitting: Obstetrics and Gynecology

## 2022-06-17 ENCOUNTER — Other Ambulatory Visit (HOSPITAL_COMMUNITY)
Admission: RE | Admit: 2022-06-17 | Discharge: 2022-06-17 | Disposition: A | Discharge status: Home / Self Care | Payer: BC Managed Care – PPO | Source: Ambulatory Visit | Attending: Obstetrics and Gynecology | Admitting: Obstetrics and Gynecology

## 2022-06-17 ENCOUNTER — Ambulatory Visit (INDEPENDENT_AMBULATORY_CARE_PROVIDER_SITE_OTHER): Payer: BC Managed Care – PPO | Admitting: Obstetrics and Gynecology

## 2022-06-17 VITALS — BP 100/70 | Ht 65.0 in | Wt 200.0 lb

## 2022-06-17 DIAGNOSIS — Z3041 Encounter for surveillance of contraceptive pills: Secondary | ICD-10-CM | POA: Diagnosis not present

## 2022-06-17 DIAGNOSIS — Z124 Encounter for screening for malignant neoplasm of cervix: Secondary | ICD-10-CM | POA: Insufficient documentation

## 2022-06-17 DIAGNOSIS — Z01419 Encounter for gynecological examination (general) (routine) without abnormal findings: Secondary | ICD-10-CM | POA: Diagnosis not present

## 2022-06-17 DIAGNOSIS — F411 Generalized anxiety disorder: Secondary | ICD-10-CM

## 2022-06-17 DIAGNOSIS — Z113 Encounter for screening for infections with a predominantly sexual mode of transmission: Secondary | ICD-10-CM

## 2022-06-17 MED ORDER — NORTREL 1/35 (28) 1-35 MG-MCG PO TABS: 1.0000 | ORAL_TABLET | Freq: Every day | ORAL | 3 refills | Status: DC

## 2022-06-17 NOTE — Patient Instructions (Signed)
I value your feedback and you entrusting us with your care. If you get a [facility] patient survey, I would appreciate you taking the time to let us know about your experience today. Thank you! ? ? ?

## 2022-06-19 LAB — CYTOLOGY - PAP
Adequacy: ABSENT
Chlamydia: NEGATIVE
Comment: NEGATIVE
Comment: NORMAL
Diagnosis: NEGATIVE
Neisseria Gonorrhea: NEGATIVE

## 2022-07-01 ENCOUNTER — Ambulatory Visit
Admission: EM | Admit: 2022-07-01 | Discharge: 2022-07-01 | Disposition: A | Discharge status: Home / Self Care | Payer: BC Managed Care – PPO | Attending: Emergency Medicine | Admitting: Emergency Medicine

## 2022-07-01 ENCOUNTER — Ambulatory Visit: Payer: Self-pay

## 2022-07-01 DIAGNOSIS — F411 Generalized anxiety disorder: Secondary | ICD-10-CM

## 2022-07-01 DIAGNOSIS — R079 Chest pain, unspecified: Secondary | ICD-10-CM

## 2022-07-01 MED ORDER — HYDROXYZINE HCL 25 MG PO TABS: 25.0000 mg | ORAL_TABLET | Freq: Four times a day (QID) | ORAL | 0 refills | Status: AC

## 2022-07-01 NOTE — ED Triage Notes (Signed)
Patient to Urgent Care with complaints of chest pain and bilateral hand tingling that started yesterday.   Describes chest pain as left sided, constantly heavy and intermittently sharp. States that she was walking at work when it began yesterday. Reports she has felt similar with anxiety before. Reports laying down and deep breathing has helped. Denies any shortness of breath.

## 2022-07-01 NOTE — Telephone Encounter (Signed)
Agree with evaluation for prolonged chest pain and nerve symptoms. Patient should follow-up for an office visit within the next 3-5 days   Eulis Foster, MD [facility]

## 2022-07-01 NOTE — Discharge Instructions (Addendum)
Go to the emergency department if you have chest pain, uncontrolled anxiety, or other concerning symptoms.    Take the hydroxyzine as directed.  Do not drive, operate machinery, or drink alcohol with this medication as it may cause drowsiness.   Follow-up with your psychiatrist as scheduled on Monday.  Follow-up with your primary care provider tomorrow.

## 2022-07-01 NOTE — ED Provider Notes (Signed)
Maria Andersen    CSN: [id number] Arrival date & time: 07/01/22  1043      History   Chief Complaint Chief Complaint  Patient presents with   Chest Pain    HPI Maria Andersen is a 26 y.o. female.  Patient presents with chest pain and bilateral hand tingling since yesterday.  The chest pain is intermittent on left side chest. It is non-radiating, feels heavy but sometimes sharp.  No aggravating factors but is alleviated with rest.  No associated symptoms.  No fever, cough, shortness of breath, focal weakness, numbness, nausea, vomiting, diaphoresis, or other symptoms. No treatment at home.  Patient reports similar symptoms in the past with anxiety episodes.  She is not on any psychiatric medications at this time because she didn't like her psychiatrist at [facility] and is scheduled to see a different one next week.  Her medical history includes anxiety and depression. Patient has a referral to see Dr. Toy Care, psychiatrist; she reports appointment scheduled on 07/07/2022.  Patient denies suicidal ideation or homicidal ideation.  No thoughts of self-harm.  The history is provided by the patient and medical records.    Past Medical History:  Diagnosis Date   Anxiety    Depression     Patient Active Problem List   Diagnosis Date Noted   Left breast lump 02/26/2021    Past Surgical History:  Procedure Laterality Date   KNEE SURGERY     TONSILLECTOMY AND ADENOIDECTOMY      OB History     Gravida  0   Para  0   Term  0   Preterm  0   AB  0   Living  0      SAB  0   IAB  0   Ectopic  0   Multiple  0   Live Births  0            Home Medications    Prior to Admission medications   Medication Sig Start Date End Date Taking? Authorizing Provider  hydrOXYzine (ATARAX) 25 MG tablet Take 1 tablet (25 mg total) by mouth every 6 (six) hours for 20 days. 07/01/22 07/21/22 Yes Sharion Balloon, NP  busPIRone (BUSPAR) 5 MG tablet Take 1 tablet (5 mg total) by mouth 3  (three) times daily. Patient not taking: Reported on 06/17/2022 06/14/21   Homero Fellers, MD  citalopram (CELEXA) 20 MG tablet TAKE 1 TABLET(20 MG) BY MOUTH DAILY Patient not taking: Reported on 06/17/2022 10/18/21   Homero Fellers, MD  citalopram (CELEXA) 20 MG tablet Take 1 tablet (20 mg total) by mouth daily. Patient not taking: Reported on 06/17/2022 03/17/22   Imagene Riches, CNM  norethindrone-ethinyl estradiol 1/35 (NORTREL 1/35, 28,) tablet Take 1 tablet by mouth daily. 37/1/69   Copland, Deirdre Evener, PA-C    Family History History reviewed. No pertinent family history.  Social History Social History   Tobacco Use   Smoking status: Never   Smokeless tobacco: Never  Vaping Use   Vaping Use: Never used  Substance Use Topics   Alcohol use: Yes    Comment: Occassional   Drug use: Never     Allergies   Patient has no known allergies.   Review of Systems Review of Systems  Constitutional:  Negative for chills and fever.  Respiratory:  Negative for cough and shortness of breath.   Cardiovascular:  Positive for chest pain. Negative for palpitations.  Gastrointestinal:  Negative for nausea and  vomiting.  Skin:  Negative for color change, rash and wound.  Neurological:  Negative for dizziness, seizures, syncope, facial asymmetry, speech difficulty, weakness, light-headedness, numbness and headaches.       Tingling in bilateral hands.  All other systems reviewed and are negative.    Physical Exam Triage Vital Signs ED Triage Vitals  Enc Vitals Group     BP      Pulse      Resp      Temp      Temp src      SpO2      Weight      Height      Head Circumference      Peak Flow      Pain Score      Pain Loc      Pain Edu?      Excl. in [place]?    No data found.  Updated Vital Signs BP (!) 139/95   Pulse (!) 102   Temp 98.1 F (36.7 C)   Resp 18   Ht 5\' 5"  (1.651 m)   Wt 200 lb (90.7 kg)   LMP 06/24/2022 (Approximate)   SpO2 97%   BMI 33.28 kg/m    Visual Acuity Right Eye Distance:   Left Eye Distance:   Bilateral Distance:    Right Eye Near:   Left Eye Near:    Bilateral Near:     Physical Exam Vitals and nursing note reviewed.  Constitutional:      General: She is not in acute distress.    Appearance: Normal appearance. She is well-developed. She is not ill-appearing.  HENT:     Mouth/Throat:     Mouth: Mucous membranes are moist.  Cardiovascular:     Rate and Rhythm: Normal rate and regular rhythm.     Heart sounds: Normal heart sounds.  Pulmonary:     Effort: Pulmonary effort is normal. No respiratory distress.     Breath sounds: Normal breath sounds.  Musculoskeletal:     Cervical back: Neck supple.     Right lower leg: No edema.     Left lower leg: No edema.  Skin:    General: Skin is warm and dry.     Capillary Refill: Capillary refill takes less than 2 seconds.  Neurological:     General: No focal deficit present.     Mental Status: She is alert and oriented to person, place, and time.     Cranial Nerves: No cranial nerve deficit.     Sensory: No sensory deficit.     Motor: No weakness.     Gait: Gait normal.  Psychiatric:        Mood and Affect: Mood normal.        Speech: Speech normal.        Behavior: Behavior normal. Behavior is cooperative.        Thought Content: Thought content does not include homicidal or suicidal ideation. Thought content does not include homicidal or suicidal plan.      UC Treatments / Results  Labs (all labs ordered are listed, but only abnormal results are displayed) Labs Reviewed - No data to display  EKG   Radiology No results found.  Procedures Procedures (including critical care time)  Medications Ordered in UC Medications - No data to display  Initial Impression / Assessment and Plan / UC Course  I have reviewed the triage vital signs and the nursing notes.  Pertinent labs & imaging results  that were available during my care of the patient were  reviewed by me and considered in my medical decision making (see chart for details).   Chest pain, Anxiety.  EKG shows sinus rhythm, rate 71, no ST elevation, no previous to compare.  Discussed limitations of evaluation of chest pain in an urgent care setting; patient declines transfer to the ED at this time.  Treating anxiety with hydroxyzine; precautions for drowsiness with this medication discussed.  Instructed patient to follow-up with her PCP tomorrow and with her new psychiatrist on 07/07/2022 as scheduled.  ED precautions discussed.  Education provided on chest pain and anxiety.  Patient agrees to plan of care.  Final Clinical Impressions(s) / UC Diagnoses   Final diagnoses:  Chest pain, unspecified type  Generalized anxiety disorder     Discharge Instructions      Go to the emergency department if you have chest pain, uncontrolled anxiety, or other concerning symptoms.    Take the hydroxyzine as directed.  Do not drive, operate machinery, or drink alcohol with this medication as it may cause drowsiness.   Follow-up with your psychiatrist as scheduled on Monday.  Follow-up with your primary care provider tomorrow.         ED Prescriptions     Medication Sig Dispense Auth. Provider   hydrOXYzine (ATARAX) 25 MG tablet Take 1 tablet (25 mg total) by mouth every 6 (six) hours for 20 days. 80 tablet Sharion Balloon, NP      PDMP not reviewed this encounter.   Sharion Balloon, NP 07/01/22 [phone]

## 2022-07-01 NOTE — Telephone Encounter (Signed)
  Chief Complaint: Chest pain, chest heaviness Symptoms: tingling in hands and feet Frequency: 1 week Pertinent Negatives: Patient denies SOB Disposition: XED /[] Urgent Care (no appt availability in office) / [] Appointment(In office/virtual)/ []  [facility]/ [] Home Care/ [] Refused Recommended Disposition /[] [facility]/ []  Follow-up with PCP Additional Notes: This started with a panic attck, but has not resolved. Pt states that she has chest pain, a feeling of chest heaviness, and tingling in hands and feet.     Reason for Disposition  [1] Chest pain (or "angina") comes and goes AND [2] is happening more often (increasing in frequency) or getting worse (increasing in severity)  (Exception: Chest pains that last only a few seconds.)  Answer Assessment - Initial Assessment Questions 1. LOCATION: "Where does it hurt?"       Left side to close 2. RADIATION: "Does the pain go anywhere else?" (e.g., into neck, jaw, arms, back)     Went to right side under arm pit 3. ONSET: "When did the chest pain begin?" (Minutes, hours or days)      A week ago - had a panic attack 4. PATTERN: "Does the pain come and go, or has it been constant since it started?"  "Does it get worse with exertion?"      Sharp pain comes and goes heaviness stays 5. DURATION: "How long does it last" (e.g., seconds, minutes, hours)     5 6. SEVERITY: "How bad is the pain?"  (e.g., Scale 1-10; mild, moderate, or severe)    - MILD (1-3): doesn't interfere with normal activities     - MODERATE (4-7): interferes with normal activities or awakens from sleep    - SEVERE (8-10): excruciating pain, unable to do any normal activities       6/10 7. CARDIAC RISK FACTORS: "Do you have any history of heart problems or risk factors for heart disease?" (e.g., angina, prior heart attack; diabetes, high blood pressure, high cholesterol, smoker, or strong family history of heart disease)     no 8. PULMONARY RISK FACTORS:  "Do you have any history of lung disease?"  (e.g., blood clots in lung, asthma, emphysema, birth control pills)     no 9. CAUSE: "What do you think is causing the chest pain?"     Unsure 10. OTHER SYMPTOMS: "Do you have any other symptoms?" (e.g., dizziness, nausea, vomiting, sweating, fever, difficulty breathing, cough)       Tingling in hands and feet 11. PREGNANCY: "Is there any chance you are pregnant?" "When was your last menstrual period?"       no  Protocols used: Chest Pain-A-AH

## 2022-07-02 NOTE — Telephone Encounter (Signed)
Patient advised as below. She refused to schedule fu appt. She reports will call back if symptoms return. She is feeling much better today.

## 2022-07-07 DIAGNOSIS — F1011 Alcohol abuse, in remission: Secondary | ICD-10-CM | POA: Diagnosis not present

## 2022-07-07 DIAGNOSIS — F411 Generalized anxiety disorder: Secondary | ICD-10-CM | POA: Diagnosis not present

## 2022-07-23 ENCOUNTER — Ambulatory Visit: Payer: BC Managed Care – PPO | Admitting: Family Medicine

## 2022-11-27 DIAGNOSIS — F411 Generalized anxiety disorder: Secondary | ICD-10-CM | POA: Diagnosis not present

## 2022-11-27 DIAGNOSIS — F4321 Adjustment disorder with depressed mood: Secondary | ICD-10-CM | POA: Diagnosis not present

## 2022-12-10 DIAGNOSIS — F4321 Adjustment disorder with depressed mood: Secondary | ICD-10-CM | POA: Diagnosis not present

## 2022-12-10 DIAGNOSIS — F411 Generalized anxiety disorder: Secondary | ICD-10-CM | POA: Diagnosis not present

## 2023-01-06 DIAGNOSIS — F4321 Adjustment disorder with depressed mood: Secondary | ICD-10-CM | POA: Diagnosis not present

## 2023-01-06 DIAGNOSIS — F411 Generalized anxiety disorder: Secondary | ICD-10-CM | POA: Diagnosis not present

## 2023-01-28 DIAGNOSIS — F411 Generalized anxiety disorder: Secondary | ICD-10-CM | POA: Diagnosis not present

## 2023-01-28 DIAGNOSIS — F4321 Adjustment disorder with depressed mood: Secondary | ICD-10-CM | POA: Diagnosis not present

## 2023-03-04 DIAGNOSIS — F4321 Adjustment disorder with depressed mood: Secondary | ICD-10-CM | POA: Diagnosis not present

## 2023-03-04 DIAGNOSIS — F411 Generalized anxiety disorder: Secondary | ICD-10-CM | POA: Diagnosis not present

## 2023-03-31 DIAGNOSIS — F4321 Adjustment disorder with depressed mood: Secondary | ICD-10-CM | POA: Diagnosis not present

## 2023-03-31 DIAGNOSIS — F411 Generalized anxiety disorder: Secondary | ICD-10-CM | POA: Diagnosis not present

## 2023-05-07 DIAGNOSIS — F411 Generalized anxiety disorder: Secondary | ICD-10-CM | POA: Diagnosis not present

## 2023-05-07 DIAGNOSIS — F4321 Adjustment disorder with depressed mood: Secondary | ICD-10-CM | POA: Diagnosis not present

## 2023-05-28 ENCOUNTER — Other Ambulatory Visit: Payer: Self-pay | Admitting: Obstetrics and Gynecology

## 2023-05-28 DIAGNOSIS — Z3041 Encounter for surveillance of contraceptive pills: Secondary | ICD-10-CM

## 2023-06-02 ENCOUNTER — Other Ambulatory Visit: Payer: Self-pay

## 2023-06-02 DIAGNOSIS — Z3041 Encounter for surveillance of contraceptive pills: Secondary | ICD-10-CM

## 2023-06-02 MED ORDER — NORTREL 1/35 (28) 1-35 MG-MCG PO TABS: 1.0000 | ORAL_TABLET | Freq: Every day | ORAL | 0 refills | Status: DC

## 2023-06-02 NOTE — Telephone Encounter (Signed)
Pt calling for refill of bc; appt 10/31st.  [phone]  Pt aware refill eRx'd by detailed msg.

## 2023-06-30 DIAGNOSIS — F411 Generalized anxiety disorder: Secondary | ICD-10-CM | POA: Diagnosis not present

## 2023-06-30 DIAGNOSIS — F4321 Adjustment disorder with depressed mood: Secondary | ICD-10-CM | POA: Diagnosis not present

## 2023-07-16 ENCOUNTER — Ambulatory Visit: Payer: BC Managed Care – PPO | Admitting: Obstetrics and Gynecology

## 2023-08-07 DIAGNOSIS — F4321 Adjustment disorder with depressed mood: Secondary | ICD-10-CM | POA: Diagnosis not present

## 2023-08-07 DIAGNOSIS — F411 Generalized anxiety disorder: Secondary | ICD-10-CM | POA: Diagnosis not present

## 2023-08-22 ENCOUNTER — Other Ambulatory Visit: Payer: Self-pay | Admitting: Obstetrics and Gynecology

## 2023-08-22 DIAGNOSIS — Z3041 Encounter for surveillance of contraceptive pills: Secondary | ICD-10-CM

## 2023-08-25 NOTE — Progress Notes (Unsigned)
PCP:  Bosie Clos, MD   No chief complaint on file.    HPI:      Ms. Maria Andersen is a 27 y.o. G0P0000 whose LMP was No LMP recorded., presents today for her annual examination.  Her menses are regular every 28-30 days, lasting 2 days.  Dysmenorrhea minimal. She does not have intermenstrual bleeding.  Sex activity: single partner, contraception - OCP (estrogen/progesterone).  Last Pap: 06/17/22 Results were: no abnormalities  Hx of STDs: none  There is no FH of breast cancer. There is no FH of ovarian cancer. The patient does not do self-breast exams.  Tobacco use: The patient denies current or previous tobacco use. Alcohol use: social drinker No drug use.  Exercise: moderately active  She does get adequate calcium but not Vitamin D in her diet. Hx of anxiety, was on citalopram and buspar. Stopped meds, waiting for psych appt with Dr. Maryruth Bun at end of the month.   Past Medical History:  Diagnosis Date   Anxiety    Depression     Past Surgical History:  Procedure Laterality Date   KNEE SURGERY     TONSILLECTOMY AND ADENOIDECTOMY      No family history on file.  Social History   Socioeconomic History   Marital status: Single    Spouse name: Not on file   Number of children: Not on file   Years of education: Not on file   Highest education level: Not on file  Occupational History   Not on file  Tobacco Use   Smoking status: Never   Smokeless tobacco: Never  Vaping Use   Vaping status: Never Used  Substance and Sexual Activity   Alcohol use: Yes    Comment: Occassional   Drug use: Never   Sexual activity: Yes    Birth control/protection: Pill  Other Topics Concern   Not on file  Social History Narrative   Not on file   Social Determinants of Health   Financial Resource Strain: Not on file  Food Insecurity: Not on file  Transportation Needs: Not on file  Physical Activity: Not on file  Stress: Not on file  Social Connections: Not on file   Intimate Partner Violence: Not on file     Current Outpatient Medications:    busPIRone (BUSPAR) 5 MG tablet, Take 1 tablet (5 mg total) by mouth 3 (three) times daily. (Patient not taking: Reported on 06/17/2022), Disp: 90 tablet, Rfl: 3   citalopram (CELEXA) 20 MG tablet, TAKE 1 TABLET(20 MG) BY MOUTH DAILY (Patient not taking: Reported on 06/17/2022), Disp: 30 tablet, Rfl: 3   citalopram (CELEXA) 20 MG tablet, Take 1 tablet (20 mg total) by mouth daily. (Patient not taking: Reported on 06/17/2022), Disp: 30 tablet, Rfl: 3   NORTREL 1/35, 28, tablet, TAKE 1 TABLET BY MOUTH DAILY, Disp: 84 tablet, Rfl: 0     ROS:  Review of Systems  Constitutional:  Negative for fatigue, fever and unexpected weight change.  Respiratory:  Negative for cough, shortness of breath and wheezing.   Cardiovascular:  Negative for chest pain, palpitations and leg swelling.  Gastrointestinal:  Negative for blood in stool, constipation, diarrhea, nausea and vomiting.  Endocrine: Negative for cold intolerance, heat intolerance and polyuria.  Genitourinary:  Negative for dyspareunia, dysuria, flank pain, frequency, genital sores, hematuria, menstrual problem, pelvic pain, urgency, vaginal bleeding, vaginal discharge and vaginal pain.  Musculoskeletal:  Negative for back pain, joint swelling and myalgias.  Skin:  Negative for rash.  Neurological:  Negative for dizziness, syncope, light-headedness, numbness and headaches.  Hematological:  Negative for adenopathy.  Psychiatric/Behavioral:  Positive for agitation. Negative for confusion, sleep disturbance and suicidal ideas. The patient is not nervous/anxious.    BREAST: No symptoms   Objective: There were no vitals taken for this visit.   Physical Exam Constitutional:      Appearance: She is well-developed.  Genitourinary:     Vulva normal.     Right Labia: No rash, tenderness or lesions.    Left Labia: No tenderness, lesions or rash.    No vaginal  discharge, erythema or tenderness.      Right Adnexa: not tender and no mass present.    Left Adnexa: not tender and no mass present.    No cervical friability or polyp.     Uterus is not enlarged or tender.  Breasts:    Right: No mass, nipple discharge, skin change or tenderness.     Left: No mass, nipple discharge, skin change or tenderness.  Neck:     Thyroid: No thyromegaly.  Cardiovascular:     Rate and Rhythm: Normal rate and regular rhythm.     Heart sounds: Normal heart sounds. No murmur heard. Pulmonary:     Effort: Pulmonary effort is normal.     Breath sounds: Normal breath sounds.  Abdominal:     Palpations: Abdomen is soft.     Tenderness: There is no abdominal tenderness. There is no guarding or rebound.  Musculoskeletal:        General: Normal range of motion.     Cervical back: Normal range of motion.  Lymphadenopathy:     Cervical: No cervical adenopathy.  Neurological:     General: No focal deficit present.     Mental Status: She is alert and oriented to person, place, and time.     Cranial Nerves: No cranial nerve deficit.  Skin:    General: Skin is warm and dry.  Psychiatric:        Mood and Affect: Mood normal.        Behavior: Behavior normal.        Thought Content: Thought content normal.        Judgment: Judgment normal.  Vitals reviewed.     Assessment/Plan: Encounter for annual routine gynecological examination  Cervical cancer screening - Plan: Cytology - PAP  Screening for STD (sexually transmitted disease) - Plan: Cytology - PAP  Encounter for surveillance of contraceptive pills - Plan: norethindrone-ethinyl estradiol 1/35 (NORTREL 1/35, 28,) tablet; OCP RF  GAD (generalized anxiety disorder)--seeing Dr. Maryruth Bun at end of month. Increase exercise.   No orders of the defined types were placed in this encounter.            GYN counsel adequate intake of calcium and vitamin D, diet and exercise     F/U  No follow-ups on  file.  Janaiah Vetrano B. Junaid Wurzer, PA-C 08/25/2023 2:01 PM

## 2023-08-27 ENCOUNTER — Ambulatory Visit (INDEPENDENT_AMBULATORY_CARE_PROVIDER_SITE_OTHER): Payer: BC Managed Care – PPO | Admitting: Obstetrics and Gynecology

## 2023-08-27 ENCOUNTER — Other Ambulatory Visit (HOSPITAL_COMMUNITY)
Admission: RE | Admit: 2023-08-27 | Discharge: 2023-08-27 | Disposition: A | Discharge status: Home / Self Care | Payer: BC Managed Care – PPO | Source: Ambulatory Visit | Attending: Obstetrics and Gynecology | Admitting: Obstetrics and Gynecology

## 2023-08-27 ENCOUNTER — Encounter: Payer: Self-pay | Admitting: Obstetrics and Gynecology

## 2023-08-27 VITALS — BP 121/79 | HR 80 | Ht 65.0 in | Wt 180.0 lb

## 2023-08-27 DIAGNOSIS — Z01419 Encounter for gynecological examination (general) (routine) without abnormal findings: Secondary | ICD-10-CM | POA: Diagnosis not present

## 2023-08-27 DIAGNOSIS — Z113 Encounter for screening for infections with a predominantly sexual mode of transmission: Secondary | ICD-10-CM

## 2023-08-27 DIAGNOSIS — F411 Generalized anxiety disorder: Secondary | ICD-10-CM

## 2023-08-27 DIAGNOSIS — Z3041 Encounter for surveillance of contraceptive pills: Secondary | ICD-10-CM

## 2023-08-27 MED ORDER — NORTREL 1/35 (28) 1-35 MG-MCG PO TABS: 1.0000 | ORAL_TABLET | Freq: Every day | ORAL | 3 refills | Status: DC

## 2023-08-27 NOTE — Patient Instructions (Signed)
I value your feedback and you entrusting us with your care. If you get a [facility] patient survey, I would appreciate you taking the time to let us know about your experience today. Thank you! ? ? ?

## 2023-08-31 LAB — CERVICOVAGINAL ANCILLARY ONLY
Chlamydia: NEGATIVE
Comment: NEGATIVE
Comment: NORMAL
Neisseria Gonorrhea: NEGATIVE

## 2023-09-03 DIAGNOSIS — F411 Generalized anxiety disorder: Secondary | ICD-10-CM | POA: Diagnosis not present

## 2023-09-03 DIAGNOSIS — F4321 Adjustment disorder with depressed mood: Secondary | ICD-10-CM | POA: Diagnosis not present

## 2023-10-22 DIAGNOSIS — F4321 Adjustment disorder with depressed mood: Secondary | ICD-10-CM | POA: Diagnosis not present

## 2023-10-22 DIAGNOSIS — F411 Generalized anxiety disorder: Secondary | ICD-10-CM | POA: Diagnosis not present

## 2023-11-14 ENCOUNTER — Other Ambulatory Visit: Payer: Self-pay | Admitting: Obstetrics and Gynecology

## 2023-11-14 DIAGNOSIS — Z3041 Encounter for surveillance of contraceptive pills: Secondary | ICD-10-CM

## 2023-12-29 ENCOUNTER — Telehealth: Payer: Self-pay

## 2023-12-29 NOTE — Telephone Encounter (Signed)
 Patient advised needs appointment. Offered today at 3:55. Patient will check to see if she can get off work and call us  back.

## 2023-12-29 NOTE — Telephone Encounter (Signed)
 TRIAGE VOICEMAIL: Patient believes she has a UTI. Inquiring if she needs an appointment or if she can get a rx.

## 2023-12-30 ENCOUNTER — Ambulatory Visit
Admission: EM | Admit: 2023-12-30 | Discharge: 2023-12-30 | Disposition: A | Discharge status: Home / Self Care | Attending: Family Medicine | Admitting: Family Medicine

## 2023-12-30 ENCOUNTER — Encounter: Payer: Self-pay | Admitting: Emergency Medicine

## 2023-12-30 ENCOUNTER — Other Ambulatory Visit: Payer: Self-pay

## 2023-12-30 DIAGNOSIS — Z113 Encounter for screening for infections with a predominantly sexual mode of transmission: Secondary | ICD-10-CM | POA: Insufficient documentation

## 2023-12-30 DIAGNOSIS — T50905A Adverse effect of unspecified drugs, medicaments and biological substances, initial encounter: Secondary | ICD-10-CM | POA: Diagnosis not present

## 2023-12-30 DIAGNOSIS — N3001 Acute cystitis with hematuria: Secondary | ICD-10-CM | POA: Insufficient documentation

## 2023-12-30 DIAGNOSIS — K567 Ileus, unspecified: Secondary | ICD-10-CM | POA: Insufficient documentation

## 2023-12-30 DIAGNOSIS — R300 Dysuria: Secondary | ICD-10-CM | POA: Diagnosis not present

## 2023-12-30 DIAGNOSIS — N390 Urinary tract infection, site not specified: Secondary | ICD-10-CM | POA: Diagnosis not present

## 2023-12-30 LAB — POCT URINALYSIS DIP (MANUAL ENTRY)
Bilirubin, UA: NEGATIVE
Glucose, UA: NEGATIVE mg/dL
Ketones, POC UA: NEGATIVE mg/dL
Nitrite, UA: NEGATIVE
Protein Ur, POC: 30 mg/dL — AB
Spec Grav, UA: 1.02
Urobilinogen, UA: 1 U/dL
pH, UA: 6.5

## 2023-12-30 LAB — POCT URINE PREGNANCY: Preg Test, Ur: NEGATIVE

## 2023-12-30 MED ORDER — METOCLOPRAMIDE HCL 10 MG PO TABS: 10.0000 mg | ORAL_TABLET | Freq: Four times a day (QID) | ORAL | 0 refills | Status: AC

## 2023-12-30 MED ORDER — SULFAMETHOXAZOLE-TRIMETHOPRIM 800-160 MG PO TABS: 1.0000 | ORAL_TABLET | Freq: Two times a day (BID) | ORAL | 0 refills | Status: AC

## 2023-12-30 NOTE — Discharge Instructions (Addendum)
 1. Drug-induced ileus (HCC) (Primary) Comments: Due to semaglutide - Semaglutide-Weight Management [facility]) therapy recommend decreasing dose to 1 mg weekly as previously was successful without secondary side effects.  Consult prescribing physician before increasing dosage again. - metoCLOPramide (REGLAN) 10 MG tablet; Take 1 tablet (10 mg total) by mouth every 6 (six) hours as needed for low GI motility and constipation.  2. Acute cystitis with hematuria - POCT urinalysis dipstick shows trace leukocytes, moderate blood, no nitrite, these findings are possibly indicative of early urinary tract infection. - POCT urine pregnancy performed in UC is negative. - Cervicovaginal swab collected and sent to lab for further testing results should be available in 1 to 2 days - Urine Culture collected and sent to lab for further testing results should be available in 2 to 3 days if there is any abnormal findings you will be contacted appropriate guidance provided - Bactrim DS 800-160 mg tablet twice daily for 5 days for treatment of possible urinary tract infection.

## 2023-12-30 NOTE — ED Triage Notes (Signed)
 Patient presents to [facility] for evaluation of 2 weeks of feeling "run down", legs feeling crampy, fatigue, nausea.  Over the weekend she began to have an increase in the nausea with emesis, decreased appetite.  Last night she began to have burning with urination, blood with wiping.  Denies changes in discharge, but would like to be tested for BV and yeast.  Denies concern for STI but willing to be tested.

## 2023-12-30 NOTE — ED Provider Notes (Signed)
 [facility] [place]  Note:  This document was prepared using Conservation officer, historic buildings and may include unintentional dictation errors.  MRN: [id number] DOB: May 26, 1996  Subjective:   Maria Andersen is a 28 y.o. female presenting for nausea/vomiting, fatigue, muscle cramps, abdominal cramping, constipation, dysuria, and hematuria.  Patient reports that nausea, leg cramping, fatigue, vomiting has been for 2 weeks, just developed urinary symptoms over the last few days.  Patient reports that she takes semaglutide for weight loss and has had great success losing 40 pounds since starting medication.  Patient reports that prior to the onset of symptoms she increased her dosage to 1.7 mg weekly.  After that she noticed that she was having constipation, fatigue, leg cramping and has had increasing nausea and vomiting.  Patient has been using ondansetron with moderate success.  Patient also reports that she has used MiraLAX and magnesium citrate magnesium citrate was successful at causing bowel movement.  As for dysuria symptoms she has not tried any over-the-counter medication and denies any vaginal discharge, vaginal irritation, increased urinary frequency, flank pain.  No current facility-administered medications for this encounter.  Current Outpatient Medications:    metoCLOPramide (REGLAN) 10 MG tablet, Take 1 tablet (10 mg total) by mouth every 6 (six) hours., Disp: 30 tablet, Rfl: 0   Semaglutide-Weight Management (WEGOVY) 1.7 MG/0.75ML SOAJ, Inject 1.7 mg into the skin once a week., Disp: , Rfl:    sulfamethoxazole-trimethoprim (BACTRIM DS) 800-160 MG tablet, Take 1 tablet by mouth 2 (two) times daily for 5 days., Disp: 10 tablet, Rfl: 0   norethindrone-ethinyl estradiol 1/35 (NORTREL 1/35, 28,) tablet, Take 1 tablet by mouth daily., Disp: 84 tablet, Rfl: 3   No Known Allergies  Past Medical History:  Diagnosis Date   Anxiety    Depression      Past Surgical History:   Procedure Laterality Date   KNEE SURGERY     TONSILLECTOMY AND ADENOIDECTOMY      History reviewed. No pertinent family history.  Social History   Tobacco Use   Smoking status: Never   Smokeless tobacco: Never  Vaping Use   Vaping status: Never Used  Substance Use Topics   Alcohol use: Yes    Comment: Occassional   Drug use: Never    ROS Refer to HPI for ROS details.  Objective:   Vitals: BP (!) 130/90 (BP Location: Left Arm)   Pulse 79   Temp 98 F (36.7 C) (Temporal)   Resp 18   LMP 12/02/2023   SpO2 98%   Physical Exam Vitals and nursing note reviewed.  Constitutional:      General: She is not in acute distress.    Appearance: She is well-developed. She is not ill-appearing or toxic-appearing.  HENT:     Head: Normocephalic and atraumatic.     Mouth/Throat:     Mouth: Mucous membranes are moist.  Cardiovascular:     Rate and Rhythm: Normal rate.  Pulmonary:     Effort: Pulmonary effort is normal. No respiratory distress.  Abdominal:     General: Bowel sounds are normal. There is no distension.     Palpations: Abdomen is soft.     Tenderness: There is no abdominal tenderness. There is no right CVA tenderness, left CVA tenderness, guarding or rebound.  Skin:    General: Skin is warm and dry.  Neurological:     General: No focal deficit present.     Mental Status: She is alert and oriented to person, place, and time.  Psychiatric:        Mood and Affect: Mood normal.        Behavior: Behavior normal.     Procedures  Results for orders placed or performed during the hospital encounter of 12/30/23 (from the past 24 hours)  POCT urinalysis dipstick     Status: Abnormal   Collection Time: 12/30/23  8:38 AM  Result Value Ref Range   Color, UA yellow    Clarity, UA clear    Glucose, UA negative mg/dL   Bilirubin, UA negative    Ketones, POC UA negative mg/dL   Spec Grav, UA 8.119    Blood, UA moderate (A)    pH, UA 6.5    Protein Ur, POC =30 (A)  mg/dL   Urobilinogen, UA 1.0 E.U./dL   Nitrite, UA Negative    Leukocytes, UA Trace (A)   POCT urine pregnancy     Status: Normal   Collection Time: 12/30/23  8:38 AM  Result Value Ref Range   Preg Test, Ur Negative     No results found.   Assessment and Plan :     Discharge Instructions      1. Drug-induced ileus (HCC) (Primary) Comments: Due to semaglutide - Semaglutide-Weight Management [facility]) therapy recommend decreasing dose to 1 mg weekly as previously was successful without secondary side effects.  Consult prescribing physician before increasing dosage again. - metoCLOPramide (REGLAN) 10 MG tablet; Take 1 tablet (10 mg total) by mouth every 6 (six) hours as needed for low GI motility and constipation.  2. Acute cystitis with hematuria - POCT urinalysis dipstick shows trace leukocytes, moderate blood, no nitrite, these findings are possibly indicative of early urinary tract infection. - POCT urine pregnancy performed in UC is negative. - Cervicovaginal swab collected and sent to lab for further testing results should be available in 1 to 2 days - Urine Culture collected and sent to lab for further testing results should be available in 2 to 3 days if there is any abnormal findings you will be contacted appropriate guidance provided - Bactrim DS 800-160 mg tablet twice daily for 5 days for treatment of possible urinary tract infection.     Alexismarie Flaim B Lalonnie Shaffer   Andora Krull, [place], [place] 12/30/23 [phone]

## 2023-12-31 LAB — URINE CULTURE
Culture: NO GROWTH
Special Requests: NORMAL

## 2023-12-31 LAB — CERVICOVAGINAL ANCILLARY ONLY
Bacterial Vaginitis (gardnerella): NEGATIVE
Candida Glabrata: NEGATIVE
Candida Vaginitis: NEGATIVE
Chlamydia: NEGATIVE
Comment: NEGATIVE
Comment: NEGATIVE
Comment: NEGATIVE
Comment: NEGATIVE
Comment: NEGATIVE
Comment: NORMAL
Neisseria Gonorrhea: NEGATIVE
Trichomonas: NEGATIVE

## 2024-02-15 NOTE — Telephone Encounter (Signed)
 Patient seen at Urgent Care 12/30/23

## 2024-06-21 ENCOUNTER — Encounter

## 2024-10-13 ENCOUNTER — Other Ambulatory Visit: Payer: Self-pay | Admitting: Obstetrics and Gynecology

## 2024-10-13 DIAGNOSIS — Z3041 Encounter for surveillance of contraceptive pills: Secondary | ICD-10-CM

## 2024-10-18 ENCOUNTER — Other Ambulatory Visit: Payer: Self-pay

## 2024-10-18 DIAGNOSIS — Z3041 Encounter for surveillance of contraceptive pills: Secondary | ICD-10-CM

## 2024-10-18 MED ORDER — NORTREL 1/35 (28) 1-35 MG-MCG PO TABS: 1.0000 | ORAL_TABLET | Freq: Every day | ORAL | 0 refills | Status: AC

## 2024-11-02 ENCOUNTER — Ambulatory Visit: Admitting: Obstetrics and Gynecology
